# Patient Record
Sex: Female | Born: 1960 | Race: White | Hispanic: No | Marital: Married | Smoking: Never smoker
Health system: Southern US, Community
[De-identification: ages and names within clinical notes are randomized; demographics above are authoritative.]

## PROBLEM LIST (undated history)

## (undated) DIAGNOSIS — C801 Malignant (primary) neoplasm, unspecified: Secondary | ICD-10-CM

## (undated) DIAGNOSIS — T8859XA Other complications of anesthesia, initial encounter: Secondary | ICD-10-CM

## (undated) DIAGNOSIS — Z9889 Other specified postprocedural states: Secondary | ICD-10-CM

## (undated) DIAGNOSIS — K219 Gastro-esophageal reflux disease without esophagitis: Secondary | ICD-10-CM

## (undated) DIAGNOSIS — J45909 Unspecified asthma, uncomplicated: Secondary | ICD-10-CM

## (undated) DIAGNOSIS — R112 Nausea with vomiting, unspecified: Secondary | ICD-10-CM

## (undated) DIAGNOSIS — G709 Myoneural disorder, unspecified: Secondary | ICD-10-CM

## (undated) DIAGNOSIS — T4145XA Adverse effect of unspecified anesthetic, initial encounter: Secondary | ICD-10-CM

## (undated) HISTORY — DX: Unspecified asthma, uncomplicated: J45.909

## (undated) HISTORY — PX: FOOT SURGERY: SHX648

## (undated) HISTORY — PX: DIAGNOSTIC LAPAROSCOPY: SUR761

## (undated) HISTORY — DX: Gastro-esophageal reflux disease without esophagitis: K21.9

---

## 1998-10-01 ENCOUNTER — Other Ambulatory Visit: Admission: RE | Admit: 1998-10-01 | Discharge: 1998-10-01 | Payer: Self-pay | Admitting: Obstetrics and Gynecology

## 1999-10-04 ENCOUNTER — Other Ambulatory Visit: Admission: RE | Admit: 1999-10-04 | Discharge: 1999-10-04 | Payer: Self-pay | Admitting: Obstetrics and Gynecology

## 2000-10-18 ENCOUNTER — Other Ambulatory Visit: Admission: RE | Admit: 2000-10-18 | Discharge: 2000-10-18 | Payer: Self-pay | Admitting: Obstetrics and Gynecology

## 2001-10-08 ENCOUNTER — Other Ambulatory Visit: Admission: RE | Admit: 2001-10-08 | Discharge: 2001-10-08 | Payer: Self-pay | Admitting: Obstetrics and Gynecology

## 2002-10-29 ENCOUNTER — Other Ambulatory Visit: Admission: RE | Admit: 2002-10-29 | Discharge: 2002-10-29 | Payer: Self-pay | Admitting: Obstetrics & Gynecology

## 2004-01-04 HISTORY — PX: CERVICAL CONIZATION W/BX: SHX1330

## 2005-11-05 ENCOUNTER — Inpatient Hospital Stay (HOSPITAL_COMMUNITY): Admission: AD | Admit: 2005-11-05 | Discharge: 2005-11-05 | Payer: Self-pay | Admitting: Obstetrics & Gynecology

## 2006-02-17 ENCOUNTER — Inpatient Hospital Stay (HOSPITAL_COMMUNITY): Admission: AD | Admit: 2006-02-17 | Discharge: 2006-02-17 | Payer: Self-pay | Admitting: Obstetrics and Gynecology

## 2006-03-06 ENCOUNTER — Inpatient Hospital Stay (HOSPITAL_COMMUNITY): Admission: AD | Admit: 2006-03-06 | Discharge: 2006-03-06 | Payer: Self-pay | Admitting: Obstetrics and Gynecology

## 2006-03-17 ENCOUNTER — Inpatient Hospital Stay (HOSPITAL_COMMUNITY): Admission: AD | Admit: 2006-03-17 | Discharge: 2006-03-20 | Payer: Self-pay | Admitting: Obstetrics & Gynecology

## 2012-11-09 ENCOUNTER — Emergency Department (HOSPITAL_COMMUNITY): Payer: BC Managed Care – PPO

## 2012-11-09 ENCOUNTER — Encounter (HOSPITAL_COMMUNITY): Payer: Self-pay | Admitting: Emergency Medicine

## 2012-11-09 ENCOUNTER — Observation Stay (HOSPITAL_COMMUNITY)
Admission: EM | Admit: 2012-11-09 | Discharge: 2012-11-10 | Disposition: A | Discharge status: Home / Self Care | Payer: BC Managed Care – PPO | Attending: Family Medicine | Admitting: Family Medicine

## 2012-11-09 DIAGNOSIS — Z7982 Long term (current) use of aspirin: Secondary | ICD-10-CM | POA: Insufficient documentation

## 2012-11-09 DIAGNOSIS — R079 Chest pain, unspecified: Principal | ICD-10-CM | POA: Diagnosis present

## 2012-11-09 DIAGNOSIS — E876 Hypokalemia: Secondary | ICD-10-CM | POA: Diagnosis present

## 2012-11-09 HISTORY — DX: Myoneural disorder, unspecified: G70.9

## 2012-11-09 LAB — URINALYSIS, ROUTINE W REFLEX MICROSCOPIC
Bilirubin Urine: NEGATIVE
Glucose, UA: NEGATIVE mg/dL
Hgb urine dipstick: NEGATIVE
Ketones, ur: NEGATIVE mg/dL
Leukocytes, UA: NEGATIVE
Nitrite: NEGATIVE
Protein, ur: NEGATIVE mg/dL
Specific Gravity, Urine: 1.013 (ref 1.005–1.030)
Urobilinogen, UA: 0.2 mg/dL (ref 0.0–1.0)
pH: 6.5 (ref 5.0–8.0)

## 2012-11-09 LAB — BASIC METABOLIC PANEL
BUN: 12 mg/dL (ref 6–23)
CO2: 24 mEq/L (ref 19–32)
Calcium: 9.7 mg/dL (ref 8.4–10.5)
Chloride: 101 mEq/L (ref 96–112)
Creatinine, Ser: 0.8 mg/dL (ref 0.50–1.10)
GFR calc Af Amer: >90 mL/min (ref >90)
GFR calc non Af Amer: 85 mL/min — ABNORMAL LOW (ref >90)
Glucose, Bld: 100 mg/dL — ABNORMAL HIGH (ref 70–99)
Potassium: 3.4 mEq/L — ABNORMAL LOW (ref 3.5–5.1)
Sodium: 136 mEq/L (ref 135–145)

## 2012-11-09 LAB — POCT I-STAT TROPONIN I: Troponin i, poc: 0 ng/mL (ref 0.00–0.08)

## 2012-11-09 LAB — POCT I-STAT, CHEM 8
BUN: 14 mg/dL (ref 6–23)
Calcium, Ion: 1.26 mmol/L — ABNORMAL HIGH (ref 1.12–1.23)
Chloride: 103 mEq/L (ref 96–112)
Creatinine, Ser: 1.1 mg/dL (ref 0.50–1.10)
Glucose, Bld: 87 mg/dL (ref 70–99)
HCT: 46 % (ref 36.0–46.0)
Hemoglobin: 15.6 g/dL — ABNORMAL HIGH (ref 12.0–15.0)
Potassium: 2.9 mEq/L — ABNORMAL LOW (ref 3.5–5.1)
Sodium: 143 mEq/L (ref 135–145)
TCO2: 26 mmol/L (ref 0–100)

## 2012-11-09 LAB — CBC
HCT: 42.9 % (ref 36.0–46.0)
Hemoglobin: 14.9 g/dL (ref 12.0–15.0)
MCH: 32.7 pg (ref 26.0–34.0)
MCHC: 34.7 g/dL (ref 30.0–36.0)
MCV: 94.3 fL (ref 78.0–100.0)
Platelets: 317 10*3/uL (ref 150–400)
RBC: 4.55 MIL/uL (ref 3.87–5.11)
RDW: 12.1 % (ref 11.5–15.5)
WBC: 10.1 10*3/uL (ref 4.0–10.5)

## 2012-11-09 LAB — D-DIMER, QUANTITATIVE (NOT AT ARMC): D-Dimer, Quant: <0.27 ug/mL-FEU (ref 0.00–0.48)

## 2012-11-09 MED ORDER — ZOLPIDEM TARTRATE 5 MG PO TABS: 5.0000 mg | ORAL_TABLET | Freq: Every evening | ORAL | Status: DC | PRN

## 2012-11-09 MED ORDER — ASPIRIN 325 MG PO TABS
325.0000 mg | ORAL_TABLET | Freq: Every day | ORAL | Status: DC
  Administered 2012-11-10: 325 mg via ORAL
  Filled 2012-11-09: qty 1

## 2012-11-09 MED ORDER — ONDANSETRON HCL 4 MG/2ML IJ SOLN: 4.0000 mg | Freq: Four times a day (QID) | INTRAMUSCULAR | Status: DC | PRN

## 2012-11-09 MED ORDER — HYDROMORPHONE HCL PF 1 MG/ML IJ SOLN: 0.5000 mg | INTRAMUSCULAR | Status: DC | PRN

## 2012-11-09 MED ORDER — ENOXAPARIN SODIUM 40 MG/0.4ML ~~LOC~~ SOLN
40.0000 mg | Freq: Every day | SUBCUTANEOUS | Status: DC
  Administered 2012-11-10: 40 mg via SUBCUTANEOUS
  Filled 2012-11-09 (×2): qty 0.4

## 2012-11-09 MED ORDER — ACETAMINOPHEN 650 MG RE SUPP: 650.0000 mg | Freq: Four times a day (QID) | RECTAL | Status: DC | PRN

## 2012-11-09 MED ORDER — SODIUM CHLORIDE 0.9 % IV SOLN
INTRAVENOUS | Status: DC
  Administered 2012-11-10: 20 mL/h via INTRAVENOUS

## 2012-11-09 MED ORDER — ALUM & MAG HYDROXIDE-SIMETH 200-200-20 MG/5ML PO SUSP: 30.0000 mL | Freq: Four times a day (QID) | ORAL | Status: DC | PRN

## 2012-11-09 MED ORDER — OXYCODONE HCL 5 MG PO TABS: 5.0000 mg | ORAL_TABLET | ORAL | Status: DC | PRN

## 2012-11-09 MED ORDER — ONDANSETRON HCL 4 MG PO TABS: 4.0000 mg | ORAL_TABLET | Freq: Four times a day (QID) | ORAL | Status: DC | PRN

## 2012-11-09 MED ORDER — ACETAMINOPHEN 325 MG PO TABS
650.0000 mg | ORAL_TABLET | Freq: Four times a day (QID) | ORAL | Status: DC | PRN
  Administered 2012-11-10: 650 mg via ORAL
  Filled 2012-11-09: qty 2

## 2012-11-09 MED ORDER — SODIUM CHLORIDE 0.9 % IJ SOLN
3.0000 mL | Freq: Two times a day (BID) | INTRAMUSCULAR | Status: DC
  Administered 2012-11-10 (×2): 3 mL via INTRAVENOUS

## 2012-11-09 NOTE — ED Provider Notes (Signed)
CSN: [id number]     Arrival date & time 11/09/12  1945 History   First MD Initiated Contact with Patient 11/09/12 2018     Chief Complaint  Patient presents with  . Chest Pain   (Consider location/radiation/quality/duration/timing/severity/associated sxs/prior Treatment) HPI Comments: 52 year old female presents with waxing and waning chest pain. She states she woke up with jaw pain today has progressed to chest pain, neck pain, and back pain. She also has some arm symptoms. She describes the pain as "agonizing". She was asked to clarify this she is unable to further specify. Has intermittent shortness of breath. She is unable to see if this gets worse with exertion or food. Denies abdominal pain. She has had intermittent diaphoresis and nausea. She travels to [place] for work but otherwise has not had any significant travel or leg swelling. No cough or hemoptysis. She states that she is in good health and has no medical problems. Does not smoke does not have a family history of early coronary disease. Currently has no chest pain.   No past medical history on file. No past surgical history on file. No family history on file. History  Substance Use Topics  . Smoking status: Never Smoker   . Smokeless tobacco: Not on file  . Alcohol Use: Yes   OB History   Grav Para Term Preterm Abortions TAB SAB Ect Mult Living                 Review of Systems  Constitutional: Positive for diaphoresis.  HENT:       Jaw pain  Respiratory: Negative for shortness of breath.   Cardiovascular: Positive for chest pain.  Gastrointestinal: Positive for nausea. Negative for abdominal pain.  Musculoskeletal: Positive for back pain and neck pain.  Neurological: Negative for weakness.  All other systems reviewed and are negative.    Allergies  Other and Prednisone  Home Medications   Current Outpatient Rx  Name  Route  Sig  Dispense  Refill  . aspirin 325 MG tablet   Oral   Take 325 mg by mouth  daily.         Marland Kitchen aspirin 81 MG tablet   Oral   Take 81 mg by mouth daily.         Marland Kitchen glucosamine-chondroitin 500-400 MG tablet   Oral   Take 1 tablet by mouth 3 (three) times daily.         Marland Kitchen GREEN COFFEE BEAN PO   Oral   Take 1 tablet by mouth daily.          BP 161/97  Pulse 73  Temp(Src) 98.2 F (36.8 C) (Oral)  Resp 20  SpO2 100% Physical Exam  Vitals reviewed. Constitutional: She is oriented to person, place, and time. She appears well-developed and well-nourished. No distress.  HENT:  Head: Normocephalic and atraumatic.  Right Ear: External ear normal.  Left Ear: External ear normal.  Nose: Nose normal.  Eyes: Right eye exhibits no discharge. Left eye exhibits no discharge.  Cardiovascular: Normal rate, regular rhythm and normal heart sounds.   Pulmonary/Chest: Effort normal and breath sounds normal. She exhibits tenderness.  Abdominal: Soft. There is no tenderness.  Musculoskeletal: She exhibits no edema and no tenderness.  Neurological: She is alert and oriented to person, place, and time.  Skin: Skin is warm and dry.    ED Course  Procedures (including critical care time) Labs Review Labs Reviewed  URINALYSIS, ROUTINE W REFLEX MICROSCOPIC - Abnormal; Notable for  the following:    APPearance CLOUDY (*)    All other components within normal limits  POCT I-STAT, CHEM 8 - Abnormal; Notable for the following:    Potassium 2.9 (*)    Calcium, Ion 1.26 (*)    Hemoglobin 15.6 (*)    All other components within normal limits  CBC  D-DIMER, QUANTITATIVE  BASIC METABOLIC PANEL  POCT I-STAT TROPONIN I   Imaging Review Dg Chest 2 View  11/09/2012   CLINICAL DATA:  Chest pain  EXAM: CHEST - 2 VIEW  COMPARISON:  02/17/2006  FINDINGS: Mild thoracic dextroscoliosis without evident underlying vertebral anomaly as before. Lungs clear. Heart size normal. No effusion.  IMPRESSION: No acute cardiopulmonary disease.   Electronically Signed   By: Oley Balm M.D.    On: 11/09/2012 21:47    EKG Interpretation     Ventricular Rate:  71 PR Interval:  157 QRS Duration: 92 QT Interval:  385 QTC Calculation: 418 R Axis:   71 Text Interpretation:  Normal sinus rhythm No acute ischemia No old tracing to compare            MDM   1. Chest pain    Patient with atypical chest pain. No risk factors except age of 72. Took full dose ASA prior to arrival. Pain free here. Given that these symptoms woke her up, radiate to her neck and arm and no other likely diagnosis, will place into obs for ACS rule out.     Audree Camel, MD 11/09/12 [phone]

## 2012-11-09 NOTE — ED Notes (Signed)
From home pt. Came in with complaint of chest pain which started at 8am this morning @8 /10 , pt. Thought that it was "indigestion" . Pt. Claimed that pain radiated on her left  breast area and neck. pain worsen with activities.  Pt. Went to Urgent [facility] at 6pm and was advised to come to ED.Pt. Also claimed of taking 4 pills of baby Aspirin at 7pm this evening. Pt. Denies SOB. Pt.  Was ambulatory going to the room and denies N/V, nor dizziness. Pain at this time is 4/10 .

## 2012-11-09 NOTE — ED Notes (Signed)
Patient transported to X-ray 

## 2012-11-09 NOTE — ED Notes (Signed)
EKG given to EDP,Ghim,MD.

## 2012-11-10 ENCOUNTER — Encounter (HOSPITAL_COMMUNITY): Payer: Self-pay | Admitting: *Deleted

## 2012-11-10 DIAGNOSIS — R079 Chest pain, unspecified: Secondary | ICD-10-CM | POA: Diagnosis present

## 2012-11-10 DIAGNOSIS — E876 Hypokalemia: Secondary | ICD-10-CM | POA: Diagnosis present

## 2012-11-10 LAB — LIPID PANEL
Cholesterol: 185 mg/dL (ref 0–200)
HDL: 53 mg/dL (ref >39)
LDL Cholesterol: 109 mg/dL — ABNORMAL HIGH (ref 0–99)
Total CHOL/HDL Ratio: 3.5 RATIO
Triglycerides: 114 mg/dL (ref <150)
VLDL: 23 mg/dL (ref 0–40)

## 2012-11-10 LAB — CBC
HCT: 37.5 % (ref 36.0–46.0)
Hemoglobin: 13 g/dL (ref 12.0–15.0)
MCH: 32.7 pg (ref 26.0–34.0)
MCHC: 34.7 g/dL (ref 30.0–36.0)
MCV: 94.5 fL (ref 78.0–100.0)
Platelets: 237 10*3/uL (ref 150–400)
RBC: 3.97 MIL/uL (ref 3.87–5.11)
RDW: 12.2 % (ref 11.5–15.5)
WBC: 6.5 10*3/uL (ref 4.0–10.5)

## 2012-11-10 LAB — TROPONIN I
Troponin I: <0.3 ng/mL (ref <0.30)
Troponin I: <0.3 ng/mL (ref <0.30)
Troponin I: <0.3 ng/mL (ref <0.30)

## 2012-11-10 LAB — BASIC METABOLIC PANEL
BUN: 12 mg/dL (ref 6–23)
CO2: 25 mEq/L (ref 19–32)
Calcium: 9.4 mg/dL (ref 8.4–10.5)
Chloride: 105 mEq/L (ref 96–112)
Creatinine, Ser: 0.93 mg/dL (ref 0.50–1.10)
GFR calc Af Amer: 82 mL/min — ABNORMAL LOW (ref >90)
GFR calc non Af Amer: 70 mL/min — ABNORMAL LOW (ref >90)
Glucose, Bld: 103 mg/dL — ABNORMAL HIGH (ref 70–99)
Potassium: 3.6 mEq/L (ref 3.5–5.1)
Sodium: 138 mEq/L (ref 135–145)

## 2012-11-10 LAB — MAGNESIUM: Magnesium: 2.1 mg/dL (ref 1.5–2.5)

## 2012-11-10 MED ORDER — POTASSIUM CHLORIDE CRYS ER 20 MEQ PO TBCR
40.0000 meq | EXTENDED_RELEASE_TABLET | Freq: Once | ORAL | Status: AC
  Administered 2012-11-10: 40 meq via ORAL
  Filled 2012-11-10: qty 2

## 2012-11-10 NOTE — ED Notes (Signed)
MD at bedside. 

## 2012-11-10 NOTE — Discharge Summary (Signed)
Physician Discharge Summary  MERCADIES CO MVH:[id number] DOB: 1960/08/10 DOA: 11/09/2012  PCP: No primary provider on file.  Admit date: 11/09/2012 Discharge date: 11/10/2012  Time spent: 50* minutes  Recommendations for Outpatient Follow-up:  1. *Follow up cardiology in 1 week  Discharge Diagnoses:  Principal Problem:   Chest pain Active Problems:   Hypokalemia   Discharge Condition: Stable  Diet recommendation: Heart healthy diet  Filed Weights   11/10/12 0100  Weight: 78.7 kg (173 lb 8 oz)    History of present illness:  52 y.o. Eaton who presents to the ED with complaints of chest pain in the center of her chest that radiates across her chest and at times also radiates into her shoulders and jaw. She rated the pain at a 8/10 and was constant and lasted 12 hours until she had been given Aspirin in the ED. She reports taking 2 Baby Aspirin at home without relief. She reports also having nausea and diaphoresis associated with the pain but denies having any SOB. She was evaluated in the ED and found to have an initial negative troponin and a normal EKG and was referred for further evaluation   Hospital Course:   Chest pain- Patient's chest pain has resolved, cardiac enzymes x 3 are negative. Called and discussed with cardiology Dr Melburn Popper, patient can follow up cardiology as outpatient for cardiac stress test. Will continue baby aspirin at home.  Hypokalemia- Potassium replaced.  Procedures:  *None  Consultations:  None  Discharge Exam: Filed Vitals:   11/10/12 0500  BP: 109/66  Pulse: 65  Temp: 97.5 F (36.4 C)  Resp: 20    Physical Exam: Head: Normocephalic, atraumatic.  Eyes: No signs of jaundice, EOMI Nose: Mucous membranes dry.  Throat: Oropharynx nonerythematous, no exudate appreciated.  Neck: supple,No deformities, masses, or tenderness noted. Lungs: Normal respiratory effort. B/L Clear to auscultation, no crackles or wheezes.  Heart: Regular RR.  S1 and S2 normal  Abdomen: BS normoactive. Soft, Nondistended, non-tender.  Extremities: No pretibial edema, no erythema  Discharge Instructions  Discharge Orders   Future Orders Complete By Expires   Diet - low sodium heart healthy  As directed    Increase activity slowly  As directed        Medication List         aspirin 81 MG tablet  Take 81 mg by mouth daily.     glucosamine-chondroitin 500-400 MG tablet  Take 1 tablet by mouth 3 (three) times daily.     GREEN COFFEE BEAN PO  Take 1 tablet by mouth daily.       Allergies  Allergen Reactions  . Other Other (See Comments)    Synthetic pain medication makes her heart race  . Prednisone Other (See Comments)    Pill form, upset stomach, states it makes her crazy      The results of significant diagnostics from this hospitalization (including imaging, microbiology, ancillary and laboratory) are listed below for reference.    Significant Diagnostic Studies: Dg Chest 2 View  11/09/2012   CLINICAL DATA:  Chest pain  EXAM: CHEST - 2 VIEW  COMPARISON:  02/17/2006  FINDINGS: Mild thoracic dextroscoliosis without evident underlying vertebral anomaly as before. Lungs clear. Heart size normal. No effusion.  IMPRESSION: No acute cardiopulmonary disease.   Electronically Signed   By: Oley Balm M.D.   On: 11/09/2012 21:47    Microbiology: No results found for this or any previous visit (from the past 240 hour(s)).  Labs: Basic Metabolic Panel:  Recent Labs Lab 11/09/12 1955 11/09/12 2034 11/10/12 0550  NA 136 143 138  K 3.4* 2.9* 3.6  CL 101 103 105  CO2 24  --  25  GLUCOSE 100* 87 103*  BUN 12 14 12   CREATININE 0.80 1.10 0.93  CALCIUM 9.7  --  9.4  MG  --   --  2.1   Liver Function Tests: No results found for this basename: AST, ALT, ALKPHOS, BILITOT, PROT, ALBUMIN,  in the last 168 hours No results found for this basename: LIPASE, AMYLASE,  in the last 168 hours No results found for this basename:  AMMONIA,  in the last 168 hours CBC:  Recent Labs Lab 11/09/12 2009 11/09/12 2034 11/10/12 0550  WBC 10.1  --  6.5  HGB 14.9 15.6* 13.0  HCT 42.9 46.0 37.5  MCV 94.3  --  94.5  PLT 317  --  237   Cardiac Enzymes:  Recent Labs Lab 11/10/12 0003 11/10/12 0550 11/10/12 1144  TROPONINI <0.30 <0.30 <0.30   BNP: BNP (last 3 results) No results found for this basename: PROBNP,  in the last 8760 hours CBG: No results found for this basename: GLUCAP,  in the last 168 hours     Signed:  LAMA,GAGAN S  [facility] 11/10/2012, 2:23 PM

## 2012-11-10 NOTE — H&P (Signed)
[facility] History and Physical  ZANITA MILLMAN ZOX:[id number] DOB: 02-Feb-1960 DOA: 11/09/2012  Referring physician:  EDP PCP: No primary provider on file.  Specialists:   Chief Complaint:  Chest Pain  HPI: Ashley Eaton is a 52 y.o. female who presents to the ED with complaints of chest pain in the center of her chest that radiates across her chest and at times also radiates into her shoulders and jaw.  She rated the pain at a 8/10 and was constant and lasted 12 hours until she had been given Aspirin in the ED.  She reports taking 2 Baby Aspirin at home without relief.   She reports also having nausea and diaphoresis associated with the pain but denies having any SOB.   She was evaluated in the ED and found to have an initial negative troponin and a normal EKG and was referred for further evaluation.      Review of Systems: The patient denies anorexia, fever, chills, headaches, weight loss, vision loss, diplopia, dizziness, decreased hearing, rhinitis, hoarseness, syncope, dyspnea on exertion, peripheral edema, balance deficits, cough, hemoptysis, abdominal pain, vomiting, diarrhea, constipation, hematemesis, melena, hematochezia, severe indigestion/heartburn, dysuria, hematuria, incontinence, muscle weakness, suspicious skin lesions, transient blindness, difficulty walking, depression, unusual weight change, abnormal bleeding, enlarged lymph nodes, angioedema, and breast masses.    Past Medical History  Diagnosis Date  . Neuromuscular disorder     Morton's neuroma, plantar fasciitis    Past Surgical History  Procedure Laterality Date  . Foot surgery  2010    For Plantar Faciitis  . Foot surgery  1992    For Morton's Neuroma    Prior to Admission medications   Medication Sig Start Date End Date Taking? Authorizing Provider  aspirin 325 MG tablet Take 325 mg by mouth daily.   Yes Historical Provider, MD  aspirin 81 MG tablet Take 81 mg by mouth daily.   Yes Historical  Provider, MD  glucosamine-chondroitin 500-400 MG tablet Take 1 tablet by mouth 3 (three) times daily.   Yes Historical Provider, MD  GREEN COFFEE BEAN PO Take 1 tablet by mouth daily.   Yes Historical Provider, MD    Allergies  Allergen Reactions  . Other Other (See Comments)    Synthetic pain medication makes her heart race  . Prednisone Other (See Comments)    Pill form, upset stomach, states it makes her crazy    Social History:  reports that she has never smoked. She does not have any smokeless tobacco history on file. She reports that she drinks alcohol. She reports that she does not use illicit drugs.     Family History  Problem Relation Age of Onset  . Heart failure Mother   . Diabetes Mother   . Cancer - Other Father     Renal Cell    (be sure to complete)   Physical Exam:  GEN:  Pleasant  Obese  52 y.o. Caucasian female  examined  and in no acute distress; cooperative with exam Filed Vitals:   11/09/12 1958 11/10/12 0004 11/10/12 0100  BP: 161/97 101/66 127/78  Pulse: 73 66 59  Temp: 98.2 F (36.8 C)  97.5 F (36.4 C)  TempSrc: Oral  Oral  Resp: 20 17 24   Height:   5\' 2"  (1.575 m)  Weight:   78.7 kg (173 lb 8 oz)  SpO2: 100% 99% 100%   Blood pressure 127/78, pulse 59, temperature 97.5 F (36.4 C), temperature source Oral, resp. rate 24, height 5\' 2"  (  1.575 m), weight 78.7 kg (173 lb 8 oz), SpO2 100.00%. PSYCH: She is alert and oriented x4; does not appear anxious does not appear depressed; affect is normal HEENT: Normocephalic and Atraumatic, Mucous membranes pink; PERRLA; EOM intact; Fundi:  Benign;  No scleral icterus, Nares: Patent, Oropharynx: Clear, Fair Dentition, Neck:  FROM, no cervical lymphadenopathy nor thyromegaly or carotid bruit; no JVD; Breasts:: Not examined CHEST WALL: No tenderness CHEST: Normal respiration, clear to auscultation bilaterally HEART: Regular rate and rhythm; no murmurs rubs or gallops BACK: No kyphosis or scoliosis; no CVA  tenderness ABDOMEN: Positive Bowel Sounds, Obese, soft non-tender; no masses, no organomegaly. Rectal Exam: Not done EXTREMITIES: No cyanosis, clubbing or edema; no ulcerations. Genitalia: not examined PULSES: 2+ and symmetric SKIN: Normal hydration no rash or ulceration CNS: Cranial nerves 2-12 grossly intact no focal neurologic deficit    Labs on Admission:  Basic Metabolic Panel:  Recent Labs Lab 11/09/12 1955 11/09/12 2034  NA 136 143  K 3.4* 2.9*  CL 101 103  CO2 24  --   GLUCOSE 100* 87  BUN 12 14  CREATININE 0.80 1.10  CALCIUM 9.7  --    Liver Function Tests: No results found for this basename: AST, ALT, ALKPHOS, BILITOT, PROT, ALBUMIN,  in the last 168 hours No results found for this basename: LIPASE, AMYLASE,  in the last 168 hours No results found for this basename: AMMONIA,  in the last 168 hours CBC:  Recent Labs Lab 11/09/12 2009 11/09/12 2034  WBC 10.1  --   HGB 14.9 15.6*  HCT 42.9 46.0  MCV 94.3  --   PLT 317  --    Cardiac Enzymes:  Recent Labs Lab 11/10/12 0003  TROPONINI <0.30    BNP (last 3 results) No results found for this basename: PROBNP,  in the last 8760 hours CBG: No results found for this basename: GLUCAP,  in the last 168 hours  Radiological Exams on Admission: Dg Chest 2 View  11/09/2012   CLINICAL DATA:  Chest pain  EXAM: CHEST - 2 VIEW  COMPARISON:  02/17/2006  FINDINGS: Mild thoracic dextroscoliosis without evident underlying vertebral anomaly as before. Lungs clear. Heart size normal. No effusion.  IMPRESSION: No acute cardiopulmonary disease.   Electronically Signed   By: Oley Balm M.D.   On: 11/09/2012 21:47     EKG: Independently reviewed.  Normal sinus Rhythm No Acute S-T changes   Assessment/Plan Principal Problem:   Chest pain Active Problems:   Hypokalemia    1.  Chest Pain-  Telemetry Monitoring, Cycle Troponins, Nitropaste, O2 ASA Rx, check Fasting Lipids.    2. Hypokalemia- Replete K= and check  Magnesium level.    3.  DVT prophylaxis with Lovenox.      Code Status:   FULL CODE Family Communication:    Husband at Bedside Disposition Plan:   Observation  Time spent:   52 Minutes  Ron Parker [facility] Pager [phone]  If 7PM-7AM, please contact night-coverage [email or web address] Password [email or web address] 11/10/2012, 5:15 AM

## 2012-11-10 NOTE — Progress Notes (Signed)
Discharge and follow up instructions reviewed with patient and husband no questions at this time . Patient discharged home.

## 2012-11-22 ENCOUNTER — Encounter: Payer: Self-pay | Admitting: Cardiology

## 2012-11-22 ENCOUNTER — Ambulatory Visit (INDEPENDENT_AMBULATORY_CARE_PROVIDER_SITE_OTHER): Payer: BC Managed Care – PPO | Admitting: Cardiology

## 2012-11-22 VITALS — BP 115/78 | HR 66 | Ht 61.0 in | Wt 177.0 lb

## 2012-11-22 DIAGNOSIS — R079 Chest pain, unspecified: Secondary | ICD-10-CM

## 2012-11-22 MED ORDER — NITROGLYCERIN 0.4 MG SL SUBL: 0.4000 mg | SUBLINGUAL_TABLET | SUBLINGUAL | Status: DC | PRN

## 2012-11-22 NOTE — Progress Notes (Signed)
Patient ID: Ashley Eaton, female   DOB: [birth year], 52 y.o.   MRN: [id number]     Patient Name: Ashley Eaton Date of Encounter: 11/22/2012  Primary Care Provider:  No primary provider on file. Primary Cardiologist:  Ashley Eaton, H   Problem List   Past Medical History  Diagnosis Date  . Neuromuscular disorder     Morton's neuroma, plantar fasciitis   Past Surgical History  Procedure Laterality Date  . Foot surgery  2010    For Plantar Faciitis  . Foot surgery  1992    For Morton's Neuroma    Allergies  Allergies  Allergen Reactions  . Other Other (See Comments)    Synthetic pain medication makes her heart race  . Prednisone Other (See Comments)    Pill form, upset stomach, states it makes her crazy    HPI  52 y.o. female who presented to the ED with complaints of chest pain in the center of her chest that radiates across her chest and at times also radiates into her shoulders and jaw. She rated the pain at a 8/10 and was constant and lasted 12 hours until she had been given Aspirin in the ED. She reports taking 2 Baby Aspirin at home without relief. She reports also having nausea and diaphoresis associated with the pain but denies having any SOB. She was evaluated in the ED and found to have an initial negative troponin and a normal EKG and was referred for further evaluation. Since the discharge in the ED she has had two more of those episodes, but much milder and of shorter duration. She denies palpitations or syncope. Her mother had multiple MIs and CHF in her 17'. She has never smoked.  Home Medications  Prior to Admission medications   Medication Sig Start Date End Date Taking? Authorizing Provider  aspirin 81 MG tablet Take 81 mg by mouth daily.   Yes Historical Provider, MD  Cyanocobalamin (B-12) 500 MCG SUBL Place under the tongue as directed.   Yes Historical Provider, MD  GLUCOSAMINE-CHONDROITIN PO Take 1 tablet by mouth daily.   Yes Historical  Provider, MD  GREEN COFFEE BEAN PO Take 1 tablet by mouth daily.   Yes Historical Provider, MD  Multiple Vitamin (MULTIVITAMIN) capsule Take 4 capsules by mouth daily.   Yes Historical Provider, MD    Family History  Family History  Problem Relation Age of Onset  . Heart failure Mother   . Diabetes Mother   . Cancer - Other Father     Renal Cell    Social History  History   Social History  . Marital Status: Married    Spouse Name: N/A    Number of Children: N/A  . Years of Education: N/A   Occupational History  . Not on file.   Social History Main Topics  . Smoking status: Never Smoker   . Smokeless tobacco: Not on file  . Alcohol Use: Yes  . Drug Use: No  . Sexual Activity: Yes    Birth Control/ Protection: IUD   Other Topics Concern  . Not on file   Social History Narrative  . No narrative on file     Review of Systems, as per HPI, otherwise negative General:  No chills, fever, night sweats or weight changes.  Cardiovascular:  No chest pain, dyspnea on exertion, edema, orthopnea, palpitations, paroxysmal nocturnal dyspnea. Dermatological: No rash, lesions/masses Respiratory: No cough, dyspnea Urologic: No hematuria, dysuria Abdominal:   No nausea, vomiting,  diarrhea, bright red blood per rectum, melena, or hematemesis Neurologic:  No visual changes, wkns, changes in mental status. All other systems reviewed and are otherwise negative except as noted above.  Physical Exam  Blood pressure 115/78, pulse 66, height 5\' 1"  (1.549 m), weight 177 lb (80.287 kg).  General: Pleasant, NAD Psych: Normal affect. Neuro: Alert and oriented X 3. Moves all extremities spontaneously. HEENT: Normal  Neck: Supple without bruits or JVD. Lungs:  Resp regular and unlabored, CTA. Heart: RRR no s3, s4, or murmurs. Abdomen: Soft, non-tender, non-distended, BS + x 4.  Extremities: No clubbing, cyanosis or edema. DP/PT/Radials 2+ and equal bilaterally.  Labs:  No results  found for this basename: CKTOTAL, CKMB, TROPONINI,  in the last 72 hours Lab Results  Component Value Date   WBC 6.5 11/10/2012   HGB 13.0 11/10/2012   HCT 37.5 11/10/2012   MCV 94.5 11/10/2012   PLT 237 11/10/2012   No results found for this basename: NA, K, CL, CO2, BUN, CREATININE, CALCIUM, LABALBU, PROT, BILITOT, ALKPHOS, ALT, AST, GLUCOSE,  in the last 168 hours Lab Results  Component Value Date   CHOL 185 11/10/2012   HDL 53 11/10/2012   LDLCALC 109* 11/10/2012   TRIG 114 11/10/2012   Lab Results  Component Value Date   DDIMER <0.27 11/09/2012   No components found with this basename: POCBNP,   Accessory Clinical Findings  echocardiogram  ECG - normal sinus rhythm, 66 beats per minute normal EKG.  Lipid Panel     Component Value Date/Time   CHOL 185 11/10/2012 0550   TRIG 114 11/10/2012 0550   HDL 53 11/10/2012 0550   CHOLHDL 3.5 11/10/2012 0550   VLDL 23 11/10/2012 0550   LDLCALC 109* 11/10/2012 0550     Assessment & Plan  52 year female   1. Chest pain - typical in character but not exertional, we will order coronary CT to evaluate for atherosclerotic burden. We will prescribe sl NTG to be used in the interim.   2. Blood pressure controlled  3. Lipid profile - mildly elevated LDL, lifestyle changes for now and further management based on coronary CT results   Follow up in 3 weeks.   Lars Masson, MD, [facility] 11/22/2012, 2:14 PM

## 2012-11-22 NOTE — Patient Instructions (Addendum)
Your physician has requested that you have cardiac CT (December 5 PER DR. Delton See). Cardiac computed tomography (CT) is a painless test that uses an x-ray machine to take clear, detailed pictures of your heart. Please follow instruction sheet as given.  Your physician recommends that you schedule a follow-up appointment [id number] DR. Delton See IN 3 WEEKS  Your physician has recommended you make the following change in your medication:   START NITRO TABLETS AS NEEDED  Your physician recommends that you continue on your current medications as directed. Please refer to the Current Medication list given to you today.

## 2012-11-26 ENCOUNTER — Telehealth: Payer: Self-pay

## 2012-11-26 ENCOUNTER — Telehealth: Payer: Self-pay | Admitting: *Deleted

## 2012-11-26 ENCOUNTER — Encounter: Payer: Self-pay | Admitting: *Deleted

## 2012-11-26 DIAGNOSIS — R079 Chest pain, unspecified: Secondary | ICD-10-CM

## 2012-11-26 NOTE — Telephone Encounter (Signed)
Ashley Eaton Cardiac CT has been rescheduled to 12/07/12 @ 3 pm. The original appointment time was 2 pm.

## 2012-11-26 NOTE — Telephone Encounter (Signed)
The pt is advised that her insurance company did not give approval for her to have Cardiac CT so Dr Delton See would like for her to have GXT instead. The pt agreed to have GXT and is aware that someone from this office will call her to schedule date and time of GXT. Message sent to [facility] to call pt to schedule test.

## 2012-12-07 ENCOUNTER — Encounter (HOSPITAL_COMMUNITY): Payer: BC Managed Care – PPO

## 2012-12-07 ENCOUNTER — Ambulatory Visit (HOSPITAL_COMMUNITY): Payer: BC Managed Care – PPO

## 2012-12-13 ENCOUNTER — Ambulatory Visit (HOSPITAL_COMMUNITY)
Admission: RE | Admit: 2012-12-13 | Discharge: 2012-12-13 | Disposition: A | Discharge status: Home / Self Care | Payer: BC Managed Care – PPO | Source: Ambulatory Visit | Attending: Cardiology | Admitting: Cardiology

## 2012-12-13 DIAGNOSIS — R079 Chest pain, unspecified: Secondary | ICD-10-CM

## 2012-12-13 DIAGNOSIS — I4949 Other premature depolarization: Secondary | ICD-10-CM | POA: Insufficient documentation

## 2012-12-21 ENCOUNTER — Ambulatory Visit (INDEPENDENT_AMBULATORY_CARE_PROVIDER_SITE_OTHER): Payer: BC Managed Care – PPO | Admitting: Cardiology

## 2012-12-21 ENCOUNTER — Encounter: Payer: Self-pay | Admitting: Cardiology

## 2012-12-21 ENCOUNTER — Encounter (INDEPENDENT_AMBULATORY_CARE_PROVIDER_SITE_OTHER): Payer: Self-pay

## 2012-12-21 VITALS — BP 110/84 | HR 72 | Ht 61.0 in | Wt 175.0 lb

## 2012-12-21 DIAGNOSIS — E785 Hyperlipidemia, unspecified: Secondary | ICD-10-CM

## 2012-12-21 DIAGNOSIS — R079 Chest pain, unspecified: Secondary | ICD-10-CM

## 2012-12-21 DIAGNOSIS — E876 Hypokalemia: Secondary | ICD-10-CM

## 2012-12-21 NOTE — Progress Notes (Signed)
Patient ID: Ashley Eaton, female   DOB: [birth year], 52 y.o.   MRN: [id number]     Patient Name: Ashley Eaton Date of Encounter: 12/21/2012  Primary Care Provider:  Delorse Lek, MD Primary Cardiologist:  Tobias Alexander, H   Problem List   Past Medical History  Diagnosis Date  . Neuromuscular disorder     Morton's neuroma, plantar fasciitis   Past Surgical History  Procedure Laterality Date  . Foot surgery  2010    For Plantar Faciitis  . Foot surgery  1992    For Morton's Neuroma    Allergies  Allergies  Allergen Reactions  . Other Other (See Comments)    Synthetic pain medication makes her heart race  . Prednisone Other (See Comments)    Pill form, upset stomach, states it makes her crazy   HPI  52 y.o. female who presented to the ED with complaints of chest pain in the center of her chest that radiates across her chest and at times also radiates into her shoulders and jaw. She rated the pain at a 8/10 and was constant and lasted 12 hours until she had been given Aspirin in the ED. She reports taking 2 Baby Aspirin at home without relief. She reports also having nausea and diaphoresis associated with the pain but denies having any SOB. She was evaluated in the ED and found to have an initial negative troponin and a normal EKG and was referred for further evaluation. Since the discharge in the ED she has had two more of those episodes, but much milder and of shorter duration. She denies palpitations or syncope. Her mother had multiple MIs and CHF in her 73'. She has never smoked.  Home Medications  Prior to Admission medications   Medication Sig Start Date End Date Taking? Authorizing Provider  aspirin 81 MG tablet Take 81 mg by mouth daily.   Yes Historical Provider, MD  Cyanocobalamin (B-12) 500 MCG SUBL Place under the tongue as directed.   Yes Historical Provider, MD  GLUCOSAMINE-CHONDROITIN PO Take 1 tablet by mouth daily.   Yes Historical Provider, MD    GREEN COFFEE BEAN PO Take 1 tablet by mouth daily.   Yes Historical Provider, MD  Multiple Vitamin (MULTIVITAMIN) capsule Take 4 capsules by mouth daily.   Yes Historical Provider, MD    Family History  Family History  Problem Relation Age of Onset  . Heart failure Mother   . Diabetes Mother   . Cancer - Other Father     Renal Cell    Social History  History   Social History  . Marital Status: Married    Spouse Name: N/A    Number of Children: N/A  . Years of Education: N/A   Occupational History  . Not on file.   Social History Main Topics  . Smoking status: Never Smoker   . Smokeless tobacco: Not on file  . Alcohol Use: Yes  . Drug Use: No  . Sexual Activity: Yes    Birth Control/ Protection: IUD   Other Topics Concern  . Not on file   Social History Narrative  . No narrative on file     Review of Systems, as per HPI, otherwise negative General:  No chills, fever, night sweats or weight changes.  Cardiovascular:  No chest pain, dyspnea on exertion, edema, orthopnea, palpitations, paroxysmal nocturnal dyspnea. Dermatological: No rash, lesions/masses Respiratory: No cough, dyspnea Urologic: No hematuria, dysuria Abdominal:   No nausea, vomiting, diarrhea, bright  red blood per rectum, melena, or hematemesis Neurologic:  No visual changes, wkns, changes in mental status. All other systems reviewed and are otherwise negative except as noted above.  Physical Exam  Blood pressure 110/84, pulse 72, height 5\' 1"  (1.549 m), weight 175 lb (79.379 kg), SpO2 97.00%.  General: Pleasant, NAD Psych: Normal affect. Neuro: Alert and oriented X 3. Moves all extremities spontaneously. HEENT: Normal  Neck: Supple without bruits or JVD. Lungs:  Resp regular and unlabored, CTA. Heart: RRR no s3, s4, or murmurs. Abdomen: Soft, non-tender, non-distended, BS + x 4.  Extremities: No clubbing, cyanosis or edema. DP/PT/Radials 2+ and equal bilaterally.  Labs:  No results  found for this basename: CKTOTAL, CKMB, TROPONINI,  in the last 72 hours Lab Results  Component Value Date   WBC 6.5 11/10/2012   HGB 13.0 11/10/2012   HCT 37.5 11/10/2012   MCV 94.5 11/10/2012   PLT 237 11/10/2012   No results found for this basename: NA, K, CL, CO2, BUN, CREATININE, CALCIUM, LABALBU, PROT, BILITOT, ALKPHOS, ALT, AST, GLUCOSE,  in the last 168 hours Lab Results  Component Value Date   CHOL 185 11/10/2012   HDL 53 11/10/2012   LDLCALC 109* 11/10/2012   TRIG 114 11/10/2012   Lab Results  Component Value Date   DDIMER <0.27 11/09/2012   No components found with this basename: POCBNP,   Accessory Clinical Findings  echocardiogram  ECG - normal sinus rhythm, 66 beats per minute normal EKG.  Lipid Panel     Component Value Date/Time   CHOL 185 11/10/2012 0550   TRIG 114 11/10/2012 0550   HDL 53 11/10/2012 0550   CHOLHDL 3.5 11/10/2012 0550   VLDL 23 11/10/2012 0550   LDLCALC 109* 11/10/2012 0550    Exercise treadmill stress test 12/13/2012 Impression: Negative for ischemia GXT with the patient exercising to an 11.7 met workload and a HR of 164, representing 96% APMHR. Normal BP response to exercise. No chest pain or ischemic ECG changes. Confirmed by Tresa Endo MD, THOMAS [phone]) on 12/13/2012 5:37:26 PM    Assessment & Plan  52 year female   1. Chest pain - typical in character but not exertional, coronary CT was not approved by insurance. The patient underwent exercise treadmill stress test and exercises for 10 minutes, achieved 11.7 METS with no symptoms. No ischemia on ECG.  She didn't need to use NTG in the interim.  2. Blood pressure - controlled  3. Lipid profile - mildly elevated LDL, lifestyle changes. The patient was advised about proper type and frequency/time of exercise (minimum 30 minutes 5 x per week). She understands and is motivated to do so.    Follow up in 1 year with lipid profile and CMP.   Lars Masson, MD, [facility] 12/21/2012, 2:33  PM

## 2012-12-21 NOTE — Patient Instructions (Signed)
Your physician wants you to follow-up in: 1 year with fasting lab work ( cmp,lipids ) You will receive a reminder letter in the mail two months in advance. If you don't receive a letter, please call our office to schedule the follow-up appointment.

## 2013-02-01 ENCOUNTER — Ambulatory Visit
Admission: RE | Admit: 2013-02-01 | Discharge: 2013-02-01 | Disposition: A | Discharge status: Home / Self Care | Payer: Managed Care, Other (non HMO) | Source: Ambulatory Visit | Attending: Family Medicine | Admitting: Family Medicine

## 2013-02-01 ENCOUNTER — Other Ambulatory Visit: Payer: Self-pay | Admitting: Family Medicine

## 2013-02-01 DIAGNOSIS — M25571 Pain in right ankle and joints of right foot: Secondary | ICD-10-CM

## 2013-09-11 ENCOUNTER — Other Ambulatory Visit (INDEPENDENT_AMBULATORY_CARE_PROVIDER_SITE_OTHER): Payer: Self-pay | Admitting: Surgery

## 2013-09-11 ENCOUNTER — Ambulatory Visit (INDEPENDENT_AMBULATORY_CARE_PROVIDER_SITE_OTHER): Payer: Managed Care, Other (non HMO) | Admitting: Surgery

## 2013-10-08 ENCOUNTER — Encounter (HOSPITAL_COMMUNITY): Payer: Self-pay | Admitting: Pharmacy Technician

## 2013-10-11 NOTE — Pre-Procedure Instructions (Addendum)
Ashley Eaton  10/11/2013   Your procedure is scheduled on:  Tuesday October 15, 2013   Report to [facility] Admitting at 9:00 AM.  Call this number if you have problems the morning of surgery: [phone]   Remember:   Do not eat food or drink liquids after midnight.   Take these medicines the morning of surgery with A SIP OF WATER: NONE   Discontinue any aspirin ,nsaids,and herbal medications (Multivitamin)    Do not wear jewelry, make-up or nail polish.  Do not wear lotions, powders, or perfumes.   Do not shave 48 hours prior to surgery.   Do not bring valuables to the hospital.  [facility] is not responsible for any belongings or valuables.               Contacts, dentures or bridgework may not be worn into surgery.  Leave suitcase in the car. After surgery it may be brought to your room.  For patients admitted to the hospital, discharge time is determined by your treatment team.               Patients discharged the day of surgery will not be allowed to drive home.  Name and phone number of your driver: Family/Friend  Special Instructions: Special Instructions: [place] - Preparing for Surgery  Before surgery, you can play an important role.  Because skin is not sterile, your skin needs to be as free of germs as possible.  You can reduce the number of germs on you skin by washing with CHG (chlorahexidine gluconate) soap before surgery.  CHG is an antiseptic cleaner which kills germs and bonds with the skin to continue killing germs even after washing.  Please DO NOT use if you have an allergy to CHG or antibacterial soaps.  If your skin becomes reddened/irritated stop using the CHG and inform your nurse when you arrive at Short Stay.  Do not shave (including legs and underarms) for at least 48 hours prior to the first CHG shower.  You may shave your face.  Please follow these instructions carefully:   1.  Shower with CHG Soap the night before surgery and the  morning of Surgery.  2.  If you choose to wash your hair, wash your hair first as usual with your normal shampoo.  3.  After you shampoo, rinse your hair and body thoroughly to remove the Shampoo.  4.  Use CHG as you would any other liquid soap.  You can apply chg directly  to the skin and wash gently with scrungie or a clean washcloth.  5.  Apply the CHG Soap to your body ONLY FROM THE NECK DOWN.  Do not use on open wounds or open sores.  Avoid contact with your eyes ears, mouth and genitals (private parts).  Wash genitals (private parts)       with your normal soap.  6.  Wash thoroughly, paying special attention to the area where your surgery will be performed.  7.  Thoroughly rinse your body with warm water from the neck down.  8.  DO NOT shower/wash with your normal soap after using and rinsing off the CHG Soap.  9.  Pat yourself dry with a clean towel.            10.  Wear clean pajamas.            11.  Place clean sheets on your bed the night of your first shower  and do not sleep with pets.  Day of Surgery  Do not apply any lotions/deodorants the morning of surgery.  Please wear clean clothes to the hospital/surgery center.  Please read over the following fact sheets that you were given: Pain Booklet, Coughing and Deep Breathing and Surgical Site Infection Prevention

## 2013-10-14 ENCOUNTER — Encounter (HOSPITAL_COMMUNITY)
Admission: RE | Admit: 2013-10-14 | Discharge: 2013-10-14 | Disposition: A | Discharge status: Home / Self Care | Payer: Managed Care, Other (non HMO) | Source: Ambulatory Visit | Attending: Surgery | Admitting: Surgery

## 2013-10-14 ENCOUNTER — Encounter (HOSPITAL_COMMUNITY): Payer: Self-pay

## 2013-10-14 DIAGNOSIS — K828 Other specified diseases of gallbladder: Secondary | ICD-10-CM | POA: Diagnosis not present

## 2013-10-14 DIAGNOSIS — E785 Hyperlipidemia, unspecified: Secondary | ICD-10-CM | POA: Diagnosis not present

## 2013-10-14 DIAGNOSIS — K219 Gastro-esophageal reflux disease without esophagitis: Secondary | ICD-10-CM | POA: Diagnosis not present

## 2013-10-14 DIAGNOSIS — K811 Chronic cholecystitis: Secondary | ICD-10-CM | POA: Diagnosis not present

## 2013-10-14 DIAGNOSIS — G4730 Sleep apnea, unspecified: Secondary | ICD-10-CM | POA: Diagnosis not present

## 2013-10-14 HISTORY — DX: Other specified postprocedural states: R11.2

## 2013-10-14 HISTORY — DX: Malignant (primary) neoplasm, unspecified: C80.1

## 2013-10-14 HISTORY — DX: Other specified postprocedural states: Z98.890

## 2013-10-14 HISTORY — DX: Other complications of anesthesia, initial encounter: T88.59XA

## 2013-10-14 HISTORY — DX: Adverse effect of unspecified anesthetic, initial encounter: T41.45XA

## 2013-10-14 LAB — CBC
HCT: 39.3 % (ref 36.0–46.0)
Hemoglobin: 13.6 g/dL (ref 12.0–15.0)
MCH: 32 pg (ref 26.0–34.0)
MCHC: 34.6 g/dL (ref 30.0–36.0)
MCV: 92.5 fL (ref 78.0–100.0)
Platelets: 265 10*3/uL (ref 150–400)
RBC: 4.25 MIL/uL (ref 3.87–5.11)
RDW: 12 % (ref 11.5–15.5)
WBC: 5.6 10*3/uL (ref 4.0–10.5)

## 2013-10-14 LAB — HCG, SERUM, QUALITATIVE: Preg, Serum: NEGATIVE

## 2013-10-14 MED ORDER — CEFAZOLIN SODIUM-DEXTROSE 2-3 GM-% IV SOLR
2.0000 g | INTRAVENOUS | Status: AC
  Administered 2013-10-15: 2 g via INTRAVENOUS
  Filled 2013-10-14: qty 50

## 2013-10-15 ENCOUNTER — Ambulatory Visit (HOSPITAL_COMMUNITY): Payer: Managed Care, Other (non HMO) | Admitting: Certified Registered Nurse Anesthetist

## 2013-10-15 ENCOUNTER — Encounter (HOSPITAL_COMMUNITY): Payer: Self-pay | Admitting: Certified Registered Nurse Anesthetist

## 2013-10-15 ENCOUNTER — Encounter (HOSPITAL_COMMUNITY)
Admission: RE | Disposition: A | Discharge status: Home / Self Care | Payer: Self-pay | Source: Ambulatory Visit | Attending: Surgery

## 2013-10-15 ENCOUNTER — Encounter (HOSPITAL_COMMUNITY): Payer: Managed Care, Other (non HMO) | Admitting: Certified Registered Nurse Anesthetist

## 2013-10-15 ENCOUNTER — Ambulatory Visit (HOSPITAL_COMMUNITY): Payer: Managed Care, Other (non HMO)

## 2013-10-15 ENCOUNTER — Ambulatory Visit (HOSPITAL_COMMUNITY)
Admission: RE | Admit: 2013-10-15 | Discharge: 2013-10-15 | Disposition: A | Discharge status: Home / Self Care | Payer: Managed Care, Other (non HMO) | Source: Ambulatory Visit | Attending: Surgery | Admitting: Surgery

## 2013-10-15 DIAGNOSIS — K811 Chronic cholecystitis: Secondary | ICD-10-CM | POA: Diagnosis not present

## 2013-10-15 DIAGNOSIS — G4730 Sleep apnea, unspecified: Secondary | ICD-10-CM | POA: Insufficient documentation

## 2013-10-15 DIAGNOSIS — E785 Hyperlipidemia, unspecified: Secondary | ICD-10-CM | POA: Insufficient documentation

## 2013-10-15 DIAGNOSIS — K828 Other specified diseases of gallbladder: Secondary | ICD-10-CM | POA: Insufficient documentation

## 2013-10-15 DIAGNOSIS — K219 Gastro-esophageal reflux disease without esophagitis: Secondary | ICD-10-CM | POA: Insufficient documentation

## 2013-10-15 HISTORY — PX: CHOLECYSTECTOMY: SHX55

## 2013-10-15 SURGERY — LAPAROSCOPIC CHOLECYSTECTOMY WITH INTRAOPERATIVE CHOLANGIOGRAM
Anesthesia: General | Site: Abdomen

## 2013-10-15 MED ORDER — HYDROMORPHONE HCL 1 MG/ML IJ SOLN
0.2500 mg | INTRAMUSCULAR | Status: DC | PRN
  Administered 2013-10-15 (×2): 0.25 mg via INTRAVENOUS

## 2013-10-15 MED ORDER — FENTANYL CITRATE 0.05 MG/ML IJ SOLN
INTRAMUSCULAR | Status: AC
  Filled 2013-10-15: qty 5

## 2013-10-15 MED ORDER — ONDANSETRON HCL 4 MG/2ML IJ SOLN: 4.0000 mg | Freq: Once | INTRAMUSCULAR | Status: DC | PRN

## 2013-10-15 MED ORDER — NEOSTIGMINE METHYLSULFATE 10 MG/10ML IV SOLN
INTRAVENOUS | Status: DC | PRN
  Administered 2013-10-15: 4 mg via INTRAVENOUS

## 2013-10-15 MED ORDER — PROPOFOL 10 MG/ML IV BOLUS
INTRAVENOUS | Status: DC | PRN
  Administered 2013-10-15: 150 mg via INTRAVENOUS

## 2013-10-15 MED ORDER — MIDAZOLAM HCL 2 MG/2ML IJ SOLN
INTRAMUSCULAR | Status: AC
  Filled 2013-10-15: qty 2

## 2013-10-15 MED ORDER — PROPOFOL 10 MG/ML IV BOLUS
INTRAVENOUS | Status: AC
  Filled 2013-10-15: qty 20

## 2013-10-15 MED ORDER — MIDAZOLAM HCL 5 MG/5ML IJ SOLN
INTRAMUSCULAR | Status: DC | PRN
  Administered 2013-10-15: 2 mg via INTRAVENOUS

## 2013-10-15 MED ORDER — LIDOCAINE HCL (CARDIAC) 20 MG/ML IV SOLN
INTRAVENOUS | Status: AC
  Filled 2013-10-15: qty 10

## 2013-10-15 MED ORDER — FENTANYL CITRATE 0.05 MG/ML IJ SOLN
INTRAMUSCULAR | Status: DC | PRN
  Administered 2013-10-15 (×2): 50 ug via INTRAVENOUS
  Administered 2013-10-15: 100 ug via INTRAVENOUS
  Administered 2013-10-15: 50 ug via INTRAVENOUS

## 2013-10-15 MED ORDER — BUPIVACAINE-EPINEPHRINE (PF) 0.25% -1:200000 IJ SOLN
INTRAMUSCULAR | Status: AC
  Filled 2013-10-15: qty 30

## 2013-10-15 MED ORDER — OXYCODONE-ACETAMINOPHEN 5-325 MG PO TABS
1.0000 | ORAL_TABLET | ORAL | Status: DC | PRN
Start: 2013-10-15 — End: 2016-04-04

## 2013-10-15 MED ORDER — LACTATED RINGERS IV SOLN
INTRAVENOUS | Status: DC
  Administered 2013-10-15 (×2): via INTRAVENOUS

## 2013-10-15 MED ORDER — ONDANSETRON HCL 4 MG/2ML IJ SOLN
INTRAMUSCULAR | Status: AC
  Filled 2013-10-15: qty 2

## 2013-10-15 MED ORDER — PHENYLEPHRINE 40 MCG/ML (10ML) SYRINGE FOR IV PUSH (FOR BLOOD PRESSURE SUPPORT)
PREFILLED_SYRINGE | INTRAVENOUS | Status: AC
  Filled 2013-10-15: qty 10

## 2013-10-15 MED ORDER — ONDANSETRON HCL 4 MG/2ML IJ SOLN
INTRAMUSCULAR | Status: DC | PRN
  Administered 2013-10-15: 4 mg via INTRAVENOUS

## 2013-10-15 MED ORDER — LIDOCAINE HCL (CARDIAC) 20 MG/ML IV SOLN
INTRAVENOUS | Status: DC | PRN
  Administered 2013-10-15: 60 mg via INTRATRACHEAL
  Administered 2013-10-15: 100 mg via INTRAVENOUS

## 2013-10-15 MED ORDER — OXYCODONE HCL 5 MG/5ML PO SOLN: 5.0000 mg | Freq: Once | ORAL | Status: DC | PRN

## 2013-10-15 MED ORDER — SODIUM CHLORIDE 0.9 % IV SOLN
INTRAVENOUS | Status: DC | PRN
  Administered 2013-10-15: 12:00:00

## 2013-10-15 MED ORDER — CHLORHEXIDINE GLUCONATE 4 % EX LIQD
1.0000 "application " | Freq: Once | CUTANEOUS | Status: DC
  Filled 2013-10-15: qty 15

## 2013-10-15 MED ORDER — GLYCOPYRROLATE 0.2 MG/ML IJ SOLN
INTRAMUSCULAR | Status: DC | PRN
  Administered 2013-10-15: 0.6 mg via INTRAVENOUS

## 2013-10-15 MED ORDER — SCOPOLAMINE 1 MG/3DAYS TD PT72
MEDICATED_PATCH | TRANSDERMAL | Status: DC | PRN
  Administered 2013-10-15: 1 via TRANSDERMAL

## 2013-10-15 MED ORDER — DEXAMETHASONE SODIUM PHOSPHATE 4 MG/ML IJ SOLN
INTRAMUSCULAR | Status: AC
  Filled 2013-10-15: qty 1

## 2013-10-15 MED ORDER — OXYCODONE HCL 5 MG PO TABS: 5.0000 mg | ORAL_TABLET | Freq: Once | ORAL | Status: DC | PRN

## 2013-10-15 MED ORDER — OXYCODONE-ACETAMINOPHEN 5-325 MG PO TABS: 1.0000 | ORAL_TABLET | ORAL | Status: DC | PRN

## 2013-10-15 MED ORDER — ARTIFICIAL TEARS OP OINT
TOPICAL_OINTMENT | OPHTHALMIC | Status: AC
  Filled 2013-10-15: qty 3.5

## 2013-10-15 MED ORDER — DIPHENHYDRAMINE HCL 50 MG/ML IJ SOLN
INTRAMUSCULAR | Status: DC | PRN
  Administered 2013-10-15: 12.5 mg via INTRAVENOUS

## 2013-10-15 MED ORDER — HYDROMORPHONE HCL 1 MG/ML IJ SOLN
INTRAMUSCULAR | Status: AC
  Filled 2013-10-15: qty 1

## 2013-10-15 MED ORDER — BUPIVACAINE-EPINEPHRINE 0.25% -1:200000 IJ SOLN
INTRAMUSCULAR | Status: DC | PRN
  Administered 2013-10-15: 15 mL

## 2013-10-15 MED ORDER — GLUCAGON HCL RDNA (DIAGNOSTIC) 1 MG IJ SOLR
INTRAMUSCULAR | Status: AC
  Filled 2013-10-15: qty 1

## 2013-10-15 MED ORDER — ONDANSETRON HCL 4 MG/2ML IJ SOLN
4.0000 mg | INTRAMUSCULAR | Status: DC | PRN
  Filled 2013-10-15: qty 2

## 2013-10-15 MED ORDER — ROCURONIUM BROMIDE 50 MG/5ML IV SOLN
INTRAVENOUS | Status: AC
  Filled 2013-10-15: qty 1

## 2013-10-15 MED ORDER — MORPHINE SULFATE 2 MG/ML IJ SOLN: 2.0000 mg | INTRAMUSCULAR | Status: DC | PRN

## 2013-10-15 MED ORDER — ROCURONIUM BROMIDE 100 MG/10ML IV SOLN
INTRAVENOUS | Status: DC | PRN
  Administered 2013-10-15: 40 mg via INTRAVENOUS

## 2013-10-15 MED ORDER — DEXAMETHASONE SODIUM PHOSPHATE 4 MG/ML IJ SOLN
INTRAMUSCULAR | Status: DC | PRN
  Administered 2013-10-15: 4 mg via INTRAVENOUS

## 2013-10-15 SURGICAL SUPPLY — 45 items
APL SKNCLS STERI-STRIP NONHPOA (GAUZE/BANDAGES/DRESSINGS) ×1
APPLIER CLIP ROT 10 11.4 M/L (STAPLE) ×2
APR CLP MED LRG 11.4X10 (STAPLE) ×1
BAG SPEC RTRVL LRG 6X4 10 (ENDOMECHANICALS) ×1
BENZOIN TINCTURE PRP APPL 2/3 (GAUZE/BANDAGES/DRESSINGS) ×2 IMPLANT
BLADE SURG ROTATE 9660 (MISCELLANEOUS) IMPLANT
CANISTER SUCTION 2500CC (MISCELLANEOUS) ×2 IMPLANT
CHLORAPREP W/TINT 26ML (MISCELLANEOUS) ×2 IMPLANT
CLIP APPLIE ROT 10 11.4 M/L (STAPLE) ×1 IMPLANT
COVER MAYO STAND STRL (DRAPES) ×2 IMPLANT
COVER SURGICAL LIGHT HANDLE (MISCELLANEOUS) ×2 IMPLANT
DRAPE C-ARM 42X72 X-RAY (DRAPES) ×2 IMPLANT
DRAPE UTILITY 15X26 W/TAPE STR (DRAPE) ×4 IMPLANT
DRSG TEGADERM 2-3/8X2-3/4 SM (GAUZE/BANDAGES/DRESSINGS) ×6 IMPLANT
DRSG TEGADERM 4X4.75 (GAUZE/BANDAGES/DRESSINGS) ×2 IMPLANT
ELECT REM PT RETURN 9FT ADLT (ELECTROSURGICAL) ×2
ELECTRODE REM PT RTRN 9FT ADLT (ELECTROSURGICAL) ×1 IMPLANT
FILTER SMOKE EVAC LAPAROSHD (FILTER) ×2 IMPLANT
GAUZE SPONGE 2X2 8PLY NS (GAUZE/BANDAGES/DRESSINGS) ×1 IMPLANT
GAUZE SPONGE 2X2 8PLY STRL LF (GAUZE/BANDAGES/DRESSINGS) ×1 IMPLANT
GLOVE BIO SURGEON STRL SZ7 (GLOVE) ×2 IMPLANT
GLOVE BIOGEL PI IND STRL 7.5 (GLOVE) ×1 IMPLANT
GLOVE BIOGEL PI INDICATOR 7.5 (GLOVE) ×1
GOWN STRL REUS W/ TWL LRG LVL3 (GOWN DISPOSABLE) ×4 IMPLANT
GOWN STRL REUS W/TWL LRG LVL3 (GOWN DISPOSABLE) ×8
KIT BASIN OR (CUSTOM PROCEDURE TRAY) ×2 IMPLANT
KIT ROOM TURNOVER OR (KITS) ×2 IMPLANT
NS IRRIG 1000ML POUR BTL (IV SOLUTION) ×2 IMPLANT
PAD ARMBOARD 7.5X6 YLW CONV (MISCELLANEOUS) ×2 IMPLANT
POUCH SPECIMEN RETRIEVAL 10MM (ENDOMECHANICALS) ×2 IMPLANT
SCISSORS LAP 5X35 DISP (ENDOMECHANICALS) ×2 IMPLANT
SET CHOLANGIOGRAPH 5 50 .035 (SET/KITS/TRAYS/PACK) ×2 IMPLANT
SET IRRIG TUBING LAPAROSCOPIC (IRRIGATION / IRRIGATOR) ×2 IMPLANT
SLEEVE ENDOPATH XCEL 5M (ENDOMECHANICALS) ×2 IMPLANT
SPECIMEN JAR SMALL (MISCELLANEOUS) ×2 IMPLANT
SPONGE GAUZE 2X2 STER 10/PKG (GAUZE/BANDAGES/DRESSINGS) ×2
STRIP CLOSURE SKIN 1/2X4 (GAUZE/BANDAGES/DRESSINGS) ×1 IMPLANT
SUT MNCRL AB 4-0 PS2 18 (SUTURE) ×2 IMPLANT
TOWEL OR 17X24 6PK STRL BLUE (TOWEL DISPOSABLE) ×2 IMPLANT
TOWEL OR 17X26 10 PK STRL BLUE (TOWEL DISPOSABLE) ×2 IMPLANT
TRAY LAPAROSCOPIC (CUSTOM PROCEDURE TRAY) ×2 IMPLANT
TROCAR XCEL BLUNT TIP 100MML (ENDOMECHANICALS) ×2 IMPLANT
TROCAR XCEL NON-BLD 11X100MML (ENDOMECHANICALS) ×2 IMPLANT
TROCAR XCEL NON-BLD 5MMX100MML (ENDOMECHANICALS) ×2 IMPLANT
TUBING INSUFFLATION (TUBING) ×2 IMPLANT

## 2013-10-15 NOTE — Transfer of Care (Signed)
Immediate Anesthesia Transfer of Care Note  Patient: Ashley Eaton  Procedure(s) Performed: Procedure(s): LAPAROSCOPIC CHOLECYSTECTOMY WITH INTRAOPERATIVE CHOLANGIOGRAM (N/A)  Patient Location: PACU  Anesthesia Type:General  Level of Consciousness: awake, alert  and oriented  Airway & Oxygen Therapy: Patient Spontanous Breathing and Patient connected to nasal cannula oxygen  Post-op Assessment: Report given to PACU RN and Post -op Vital signs reviewed and stable  Post vital signs: Reviewed and stable  Complications: No apparent anesthesia complications

## 2013-10-15 NOTE — Anesthesia Procedure Notes (Signed)
Procedure Name: Intubation Date/Time: 10/15/2013 11:08 AM Performed by: Maryland Pink Pre-anesthesia Checklist: Patient identified, Emergency Drugs available, Suction available, Patient being monitored and Timeout performed Patient Re-evaluated:Patient Re-evaluated prior to inductionOxygen Delivery Method: Circle system utilized Preoxygenation: Pre-oxygenation with 100% oxygen Intubation Type: IV induction Ventilation: Mask ventilation without difficulty and Oral airway inserted - appropriate to patient size Laryngoscope Size: Mac and 3 Grade View: Grade II Tube type: Oral Tube size: 7.0 mm Number of attempts: 1 Airway Equipment and Method: Stylet and LTA kit utilized Placement Confirmation: ETT inserted through vocal cords under direct vision,  positive ETCO2 and breath sounds checked- equal and bilateral Secured at: 21 cm Tube secured with: Tape Dental Injury: Teeth and Oropharynx as per pre-operative assessment

## 2013-10-15 NOTE — Op Note (Signed)
Laparoscopic Cholecystectomy with IOC Procedure Note  Indications: This patient presents with symptomatic gallbladder disease and will undergo laparoscopic cholecystectomy.  Pre-operative Diagnosis: Chronic cholecystitis  Post-operative Diagnosis: Same  Surgeon: Nandika Stetzer K.   Assistants: none  Anesthesia: General endotracheal anesthesia  ASA Class: 2  Procedure Details  The patient was seen again in the Holding Room. The risks, benefits, complications, treatment options, and expected outcomes were discussed with the patient. The possibilities of reaction to medication, pulmonary aspiration, perforation of viscus, bleeding, recurrent infection, finding a normal gallbladder, the need for additional procedures, failure to diagnose a condition, the possible need to convert to an open procedure, and creating a complication requiring transfusion or operation were discussed with the patient. The likelihood of improving the patient's symptoms with return to their baseline status is good.  The patient and/or family concurred with the proposed plan, giving informed consent. The site of surgery properly noted. The patient was taken to Operating Room, identified as Ashley Eaton and the procedure verified as Laparoscopic Cholecystectomy with Intraoperative Cholangiogram. A Time Out was held and the above information confirmed.  Prior to the induction of general anesthesia, antibiotic prophylaxis was administered. General endotracheal anesthesia was then administered and tolerated well. After the induction, the abdomen was prepped with Chloraprep and draped in the sterile fashion. The patient was positioned in the supine position.  Local anesthetic agent was injected into the skin near the umbilicus and an incision made. We dissected down to the abdominal fascia with blunt dissection.  The fascia was incised vertically and we entered the peritoneal cavity bluntly.  A pursestring suture of 0-Vicryl was  placed around the fascial opening.  The Hasson cannula was inserted and secured with the stay suture.  Pneumoperitoneum was then created with CO2 and tolerated well without any adverse changes in the patient's vital signs. An 11-mm port was placed in the subxiphoid position.  Two 5-mm ports were placed in the right upper quadrant. All skin incisions were infiltrated with a local anesthetic agent before making the incision and placing the trocars.   We positioned the patient in reverse Trendelenburg, tilted slightly to the patient's left.  The gallbladder was identified, the fundus grasped and retracted cephalad. Adhesions were lysed bluntly and with the electrocautery where indicated, taking care not to injure any adjacent organs or viscus. The infundibulum was grasped and retracted laterally, exposing the peritoneum overlying the triangle of Calot. This was then divided and exposed in a blunt fashion. A critical view of the cystic duct and cystic artery was obtained.  The cystic duct was clearly identified and bluntly dissected circumferentially. The cystic duct was ligated with a clip distally.   An incision was made in the cystic duct and the [facility] cholangiogram catheter introduced. The catheter was secured using a clip. A cholangiogram was then obtained which showed good visualization of the distal and proximal biliary tree with no sign of filling defects or obstruction.  Contrast flowed easily into the duodenum. The catheter was then removed.   The cystic duct was then ligated with clips and divided. The cystic artery was identified, dissected free, ligated with clips and divided as well.   The gallbladder was dissected from the liver bed in retrograde fashion with the electrocautery. The gallbladder was removed and placed in an Endocatch sac. The liver bed was irrigated and inspected. Hemostasis was achieved with the electrocautery. Copious irrigation was utilized and was repeatedly aspirated until clear.   The gallbladder and Endocatch sac were then  removed through the umbilical port site.  The pursestring suture was used to close the umbilical fascia.    We again inspected the right upper quadrant for hemostasis.  Pneumoperitoneum was released as we removed the trocars.  4-0 Monocryl was used to close the skin.   Benzoin, steri-strips, and clean dressings were applied. The patient was then extubated and brought to the recovery room in stable condition. Instrument, sponge, and needle counts were correct at closure and at the conclusion of the case.   Findings: Cholecystitis without Cholelithiasis  Estimated Blood Loss: Minimal         Drains: none         Specimens: Gallbladder           Complications: None; patient tolerated the procedure well.         Disposition: PACU - hemodynamically stable.         Condition: stable  Imogene Burn. Georgette Dover, MD, [facility] Surgery  General/ Trauma Surgery  10/15/2013 11:58 AM

## 2013-10-15 NOTE — H&P (Signed)
History of Present Illness Ashley Eaton. Ashley Massett MD; 09/11/2013 4:51 PM) Patient words: gallbladder.  The patient is a 53 year old female who presents with abdominal pain. Symptoms include abdominal pain (Right upper quadrant with radiation around the right side of her back) and nausea. The pain is located in the right upper quadrant. The pain radiates to the right flank. The patient describes the pain as crampy and dull. Onset was sudden 2 month(s) ago. The symptoms occur intermittently. The episodes occur daily. The patient describes this as severe and worsening. Symptoms are exacerbated by eating and fatty foods. Past evaluation has included complete blood count, liver enzymes and abdominal ultrasound (and HIDA scan).  Note:08/06/13  CBC WBC 8.3 Hgb 13.9 Plts 266  LFT's WNL Lipase/ Amylase normal  [place] 08/06/13 - no gallstones or wall thickening visualized. no pericholecystic fluid. + sonographic Murphy's sign, nl CBD  HIDA scan 08/08/13 - no filling; EF 36%. Significant symptoms with CCK infusion.   Other Problems Ashley Eaton [place], Ashley Eaton; 09/11/2013 3:55 PM) Back Pain Cancer Gastroesophageal Reflux Disease General anesthesia - complications Sleep Apnea  Past Surgical History (Ashley Eaton, Ashley Eaton; 09/11/2013 3:55 PM) Foot Surgery Bilateral.  Diagnostic Studies History (Ashley Eaton, Ashley Eaton; 09/11/2013 3:55 PM) Mammogram within last year Pap Smear 1-5 years ago  Allergies (Ashley Eaton, Ashley Eaton; 09/11/2013 3:57 PM) PredniSONE *CORTICOSTEROIDS*  Medication History (Ashley Eaton, Ashley Eaton; 09/11/2013 3:59 PM) Aspirin EC (81MG  Tablet DR, Oral) Active. Vitamin B 12 (50MCG Tablet, Oral) Active. Glucosamine HCl (500MG  Tablet, Oral) Active. Multivitamin (Oral) Active. Nitrostat (0.4MG  Tab Sublingual, Sublingual) Active. Medications Reconciled  Social History Ashley Eaton [place], Ashley Eaton; 09/11/2013 3:55 PM) Alcohol use Occasional alcohol use. Caffeine use  Carbonated beverages, Coffee. No drug use Tobacco use Never smoker.  Family History (Ashley Eaton, Ashley Eaton; 09/11/2013 3:55 PM) Cerebrovascular Accident Father. Diabetes Mellitus Mother, Sister. Heart Disease Mother. Heart disease in female family member before age 40 Kidney Disease Father. Seizure disorder Father.  Pregnancy / Birth History Ashley Eaton Ashley Eaton, Ashley Eaton; 09/11/2013 3:55 PM) Age at menarche 31 years. Age of menopause <47 Contraceptive History Intrauterine device. Gravida 5 Irregular periods Maternal age 25-30 Para 3  Review of Systems Ashley Eaton. Ashley Majano MD; 09/11/2013 4:49 PM) General Present- Fatigue and Night Sweats. Not Present- Appetite Loss, Chills, Fever, Weight Gain and Weight Loss. Skin Present- Change in Wart/Mole. Not Present- Dryness, Hives, Jaundice, New Lesions, Non-Healing Wounds, Rash and Ulcer. HEENT Present- Oral Ulcers. Not Present- Earache, Hearing Loss, Hoarseness, Nose Bleed, Ringing in the Ears, Seasonal Allergies, Sinus Pain, Sore Throat, Visual Disturbances, Wears glasses/contact lenses and Yellow Eyes. Breast Not Present- Breast Mass, Breast Pain, Nipple Discharge and Skin Changes. Cardiovascular Present- Leg Cramps. Not Present- Chest Pain, Difficulty Breathing Lying Down, Palpitations, Rapid Heart Rate, Shortness of Breath and Swelling of Extremities. Gastrointestinal Present- Abdominal Pain, Bloating and Nausea. Not Present- Bloody Stool, Change in Bowel Habits, Chronic diarrhea, Constipation, Difficulty Swallowing, Excessive gas, Gets full quickly at meals, Hemorrhoids, Indigestion, Rectal Pain and Vomiting. Female Genitourinary Present- Nocturia. Not Present- Frequency, Painful Urination, Pelvic Pain and Urgency. Musculoskeletal Present- Joint Stiffness. Not Present- Back Pain, Joint Pain, Muscle Pain, Muscle Weakness and Swelling of Extremities. Neurological Present- Numbness. Not Present- Decreased Memory, Fainting, Headaches,  Seizures, Tingling, Tremor, Trouble walking and Weakness. Psychiatric Present- Change in Sleep Pattern. Not Present- Anxiety, Bipolar, Depression, Fearful and Frequent crying. Endocrine Present- Hot flashes. Not Present- Cold Intolerance, Excessive Hunger, Hair Changes, Heat Intolerance and New Diabetes.   Vitals (Ashley Eaton Ashley Eaton; 09/11/2013 4:03 PM) 09/11/2013 4:00 PM Weight: 177  lb Height: 61.5in Body Surface Area: 1.87 m Body Mass Index: 32.9 kg/m Temp.: 97.71F(Oral)  Pulse: 109 (Regular)  BP: 106/80 (Sitting, Right Arm, Standard)    Physical Exam Ashley Key K. Jayde Daffin MD; 09/11/2013 4:49 PM) The physical exam findings are as follows: Note:WDWN in NAD HEENT: EOMI, sclera anicteric Neck: No masses, no thyromegaly Lungs: CTA bilaterally; normal respiratory effort CV: Regular rate and rhythm; no murmurs Abd: +bowel sounds, soft, tender in RUQ; no palpable masses Ext: Well-perfused; no edema Skin: Warm, dry; no sign of jaundice    Assessment & Plan Ashley Key K. Ashley Galen MD; 09/11/2013 4:53 PM) CHRONIC CHOLECYSTITIS WITHOUT CALCULUS (575.11  K81.1) Current Plans  Schedule for Surgery Note:Laparoscopic cholecystectomy with intraoperative cholangiogram. The surgical procedure has been discussed with the patient. Potential risks, benefits, alternative treatments, and expected outcomes have been explained. All of the patient's questions at this time have been answered. The likelihood of reaching the patient's treatment goal is good. The patient understand the proposed surgical procedure and wishes to proceed.  Ashley Eaton. Ashley Dover, MD, [facility] Surgery  General/ Trauma Surgery  10/15/2013 9:19 AM

## 2013-10-15 NOTE — Discharge Instructions (Signed)
[facility] °LAPAROSCOPIC SURGERY: POST OP INSTRUCTIONS °Always review your discharge instruction sheet given to you by the facility where your surgery was performed. °IF YOU HAVE DISABILITY OR FAMILY LEAVE FORMS, YOU MUST BRING THEM TO THE OFFICE FOR PROCESSING.   °DO NOT GIVE THEM TO YOUR DOCTOR. ° °1. A prescription for pain medication will be given to you upon discharge.  Take your pain medication as prescribed, if needed.  If narcotic pain medicine is not needed, then you may take acetaminophen (Tylenol) or ibuprofen (Advil) as needed. °2. Take your usually prescribed medications unless otherwise directed. °3. If you need a refill on your pain medication, please contact your pharmacy.  They will contact our office to request authorization. Prescriptions will not be filled after 5pm or on week-ends. °4. You should follow a light diet the first few days after arrival home, such as soup and crackers, etc.  Be sure to include lots of fluids daily. °5. Most patients will experience some swelling and bruising in the area of the incisions.  Ice packs will help.  Swelling and bruising can take several days to resolve.  °6. It is common to experience some constipation if taking pain medication after surgery.  Increasing fluid intake and taking a stool softener (such as Colace) will usually help or prevent this problem from occurring.  A mild laxative (Milk of Magnesia or Miralax) should be taken according to package instructions if there are no bowel movements after 48 hours. °7. Unless discharge instructions indicate otherwise, you may remove your bandages 48 hours after surgery, and you may shower at that time.  You will have steri-strips (small skin tapes) in place directly over the incision.  These strips should be left on the skin for 7-10 days.  If your surgeon used skin glue on the incision, you may shower in 24 hours.  The glue will flake off over the next 2-3 weeks.  Any sutures or staples  will be removed at the office during your follow-up visit. °8. ACTIVITIES:  You may resume regular (light) daily activities beginning the next day--such as daily self-care, walking, climbing stairs--gradually increasing activities as tolerated.  You may have sexual intercourse when it is comfortable.  Refrain from any heavy lifting or straining until approved by your doctor. °a. You may drive when you are no longer taking prescription pain medication, you can comfortably wear a seatbelt, and you can safely maneuver your car and apply brakes. °b. RETURN TO WORK:   2-3 weeks °9. You should see your doctor in the office for a follow-up appointment approximately 2-3 weeks after your surgery.  Make sure that you call for this appointment within a day or two after you arrive home to insure a convenient appointment time. °10. OTHER INSTRUCTIONS: ________________________________________________________________________ °WHEN TO CALL YOUR DOCTOR: °1. Fever over 101.0 °2. Inability to urinate °3. Continued bleeding from incision. °4. Increased pain, redness, or drainage from the incision. °5. Increasing abdominal pain ° °The clinic staff is available to answer your questions during regular business hours.  Please don’t hesitate to call and ask to speak to one of the nurses for clinical concerns.  If you have a medical emergency, go to the nearest emergency room or call 911.  A surgeon from [facility] is always on call at the hospital. °[address], [address], [place], [place]  [address] ? [address] [address], [place], [place]   [address] °[phone] ? [phone] ? FAX [phone] °Web site:   [email or web address]   What to eat:  For your first meals, you should eat lightly; only small meals initially.  If you do not have nausea, you may eat larger meals.  Avoid spicy, greasy and heavy food.    General Anesthesia, Adult, Care After  Refer to this sheet in the next few weeks. These  instructions provide you with information on caring for yourself after your procedure. Your health care provider may also give you more specific instructions. Your treatment has been planned according to current medical practices, but problems sometimes occur. Call your health care provider if you have any problems or questions after your procedure.  WHAT TO EXPECT AFTER THE PROCEDURE  After the procedure, it is typical to experience:  Sleepiness.  Nausea and vomiting. HOME CARE INSTRUCTIONS  For the first 24 hours after general anesthesia:  Have a responsible person with you.  Do not drive a car. If you are alone, do not take public transportation.  Do not drink alcohol.  Do not take medicine that has not been prescribed by your health care provider.  Do not sign important papers or make important decisions.  You may resume a normal diet and activities as directed by your health care provider.  Change bandages (dressings) as directed.  If you have questions or problems that seem related to general anesthesia, call the hospital and ask for the anesthetist or anesthesiologist on call. SEEK MEDICAL CARE IF:  You have nausea and vomiting that continue the day after anesthesia.  You develop a rash. SEEK IMMEDIATE MEDICAL CARE IF:  You have difficulty breathing.  You have chest pain.  You have any allergic problems. Document Released: 03/28/2000 Document Revised: 08/22/2012 Document Reviewed: 07/05/2012  [facility] Patient Information 2014 [place], [place].

## 2013-10-15 NOTE — Anesthesia Preprocedure Evaluation (Signed)
Anesthesia Evaluation  Patient identified by MRN, date of birth, ID band Patient awake    Reviewed: Allergy & Precautions, H&P , NPO status , Patient's Chart, lab work & pertinent test results  History of Anesthesia Complications (+) PONV  Airway Mallampati: II TM Distance: >3 FB Neck ROM: Full    Dental  (+) Teeth Intact, Dental Advisory Given   Pulmonary neg pulmonary ROS,    Pulmonary exam normal       Cardiovascular negative cardio ROS      Neuro/Psych negative neurological ROS  negative psych ROS   GI/Hepatic negative GI ROS, Neg liver ROS,   Endo/Other  negative endocrine ROS  Renal/GU negative Renal ROS     Musculoskeletal   Abdominal   Peds  Hematology   Anesthesia Other Findings   Reproductive/Obstetrics                           Anesthesia Physical Anesthesia Plan  ASA: II  Anesthesia Plan: General   Post-op Pain Management:    Induction: Intravenous  Airway Management Planned: Oral ETT  Additional Equipment:   Intra-op Plan:   Post-operative Plan: Extubation in OR  Informed Consent: I have reviewed the patients History and Physical, chart, labs and discussed the procedure including the risks, benefits and alternatives for the proposed anesthesia with the patient or authorized representative who has indicated his/her understanding and acceptance.   Dental advisory given  Plan Discussed with: Anesthesiologist and Surgeon  Anesthesia Plan Comments:         Anesthesia Quick Evaluation

## 2013-10-15 NOTE — Anesthesia Postprocedure Evaluation (Signed)
  Anesthesia Post-op Note  Patient: Ashley Eaton  Procedure(s) Performed: Procedure(s): LAPAROSCOPIC CHOLECYSTECTOMY WITH INTRAOPERATIVE CHOLANGIOGRAM (N/A)  Patient Location: PACU  Anesthesia Type: No value filed.   Level of Consciousness: awake, alert  and oriented  Airway and Oxygen Therapy: Patient Spontanous Breathing  Post-op Pain: mild  Post-op Assessment: Post-op Vital signs reviewed  Post-op Vital Signs: Reviewed  Last Vitals:  Filed Vitals:   10/15/13 1254  BP: 84/40  Pulse: 50  Temp:   Resp: 15    Complications: No apparent anesthesia complications

## 2013-10-15 NOTE — Progress Notes (Signed)
Pt bp decreased to 80s/40s. HR =40s-50s. Pt still arousable and oriented. Dr.Crews notified. 500cc IVFB ordered. Will give and cont to monitor closely

## 2013-10-16 ENCOUNTER — Encounter (HOSPITAL_COMMUNITY): Payer: Self-pay | Admitting: Surgery

## 2014-02-18 ENCOUNTER — Ambulatory Visit: Payer: Managed Care, Other (non HMO) | Admitting: Cardiology

## 2014-04-21 ENCOUNTER — Ambulatory Visit (INDEPENDENT_AMBULATORY_CARE_PROVIDER_SITE_OTHER): Payer: Managed Care, Other (non HMO) | Admitting: Cardiology

## 2014-04-21 ENCOUNTER — Encounter: Payer: Self-pay | Admitting: Cardiology

## 2014-04-21 VITALS — BP 130/96 | HR 79 | Ht 61.0 in | Wt 180.2 lb

## 2014-04-21 DIAGNOSIS — R079 Chest pain, unspecified: Secondary | ICD-10-CM

## 2014-04-21 DIAGNOSIS — E876 Hypokalemia: Secondary | ICD-10-CM | POA: Diagnosis not present

## 2014-04-21 DIAGNOSIS — E785 Hyperlipidemia, unspecified: Secondary | ICD-10-CM | POA: Diagnosis not present

## 2014-04-21 LAB — CBC WITH DIFFERENTIAL/PLATELET
Basophils Absolute: 0 10*3/uL (ref 0.0–0.1)
Basophils Relative: 0.4 % (ref 0.0–3.0)
Eosinophils Absolute: 0.2 10*3/uL (ref 0.0–0.7)
Eosinophils Relative: 3.6 % (ref 0.0–5.0)
HCT: 39.3 % (ref 36.0–46.0)
Hemoglobin: 13.3 g/dL (ref 12.0–15.0)
Lymphocytes Relative: 29.6 % (ref 12.0–46.0)
Lymphs Abs: 1.5 10*3/uL (ref 0.7–4.0)
MCHC: 33.9 g/dL (ref 30.0–36.0)
MCV: 93.8 fl (ref 78.0–100.0)
Monocytes Absolute: 0.4 10*3/uL (ref 0.1–1.0)
Monocytes Relative: 7.6 % (ref 3.0–12.0)
Neutro Abs: 3 10*3/uL (ref 1.4–7.7)
Neutrophils Relative %: 58.8 % (ref 43.0–77.0)
Platelets: 293 10*3/uL (ref 150.0–400.0)
RBC: 4.18 Mil/uL (ref 3.87–5.11)
RDW: 12.6 % (ref 11.5–15.5)
WBC: 5.2 10*3/uL (ref 4.0–10.5)

## 2014-04-21 LAB — COMPREHENSIVE METABOLIC PANEL
ALT: 27 U/L (ref 0–35)
AST: 20 U/L (ref 0–37)
Albumin: 4.2 g/dL (ref 3.5–5.2)
Alkaline Phosphatase: 69 U/L (ref 39–117)
BUN: 14 mg/dL (ref 6–23)
CO2: 26 mEq/L (ref 19–32)
Calcium: 9.7 mg/dL (ref 8.4–10.5)
Chloride: 107 mEq/L (ref 96–112)
Creatinine, Ser: 0.85 mg/dL (ref 0.40–1.20)
GFR: 74.8 mL/min (ref >60.00)
Glucose, Bld: 88 mg/dL (ref 70–99)
Potassium: 4 mEq/L (ref 3.5–5.1)
Sodium: 139 mEq/L (ref 135–145)
Total Bilirubin: 0.3 mg/dL (ref 0.2–1.2)
Total Protein: 7.2 g/dL (ref 6.0–8.3)

## 2014-04-21 LAB — TSH: TSH: 2.45 u[IU]/mL (ref 0.35–4.50)

## 2014-04-21 NOTE — Patient Instructions (Signed)
Medication Instructions:   Your physician recommends that you continue on your current medications as directed. Please refer to the Current Medication list given to you today.   Labwork:  TODAY--  CMET, CBC W DIFF, TSH, NMR W LIPIDS    Follow-Up:  Your physician wants you to follow-up in: [place] will receive a reminder letter in the mail two months in advance. If you don't receive a letter, please call our office to schedule the follow-up appointment.

## 2014-04-21 NOTE — Progress Notes (Signed)
Patient ID: Ashley Eaton, female   DOB: June 14, 1960, 54 y.o.   MRN: [id number]    Patient Name: Ashley Eaton Date of Encounter: 04/21/2014  Primary Care Provider:  Stephens Shire, MD Primary Cardiologist:  Dorothy Spark  Problem List   Past Medical History  Diagnosis Date  . Complication of anesthesia   . PONV (postoperative nausea and vomiting)   . Neuromuscular disorder     Morton's neuroma, plantar fasciitis s/p 10  . Cancer 06?    vulva   Past Surgical History  Procedure Laterality Date  . Foot surgery Bilateral 2010,2011    For Plantar Faciitis  . Foot surgery Bilateral 1992,95    For Morton's Neuroma  . Cervical conization w/bx  06  . Diagnostic laparoscopy      endometriosis- 54 yrs old  . Cholecystectomy N/A 10/15/2013    Procedure: LAPAROSCOPIC CHOLECYSTECTOMY WITH INTRAOPERATIVE CHOLANGIOGRAM;  Surgeon: Donnie Mesa, MD;  Location: [facility];  Service: General;  Laterality: N/A;    Allergies  Allergies  Allergen Reactions  . Other Other (See Comments)    Synthetic pain medication makes her heart race ?name  . Prednisone Other (See Comments)    tablet form, upset stomach, states it makes her crazy   HPI  54 y.o. female who presented to the ED with complaints of chest pain in the center of her chest that radiates across her chest and at times also radiates into her shoulders and jaw. She rated the pain at a 8/10 and was constant and lasted 12 hours until she had been given Aspirin in the ED. She reports taking 2 Baby Aspirin at home without relief. She reports also having nausea and diaphoresis associated with the pain but denies having any SOB. She was evaluated in the ED and found to have an initial negative troponin and a normal EKG and was referred for further evaluation. Since the discharge in the ED she has had two more of those episodes, but much milder and of shorter duration. She denies palpitations or syncope. Her mother had multiple MIs and CHF in  her 89'. She has never smoked.  04/21/14 - the patient is coming after 1 year, she continues to perform strenuous work, no limitations. She ran a few 5K runs with no problem. Continues to have atypical left axillar pain at rest. She sticks to a healthy diet. No palpitations, syncope, no LE edema, orthopnea.  Home Medications  Prior to Admission medications   Medication Sig Start Date End Date Taking? Authorizing Provider  aspirin 81 MG tablet Take 81 mg by mouth daily.   Yes Historical Provider, MD  Cyanocobalamin (B-12) 500 MCG SUBL Place under the tongue as directed.   Yes Historical Provider, MD  GLUCOSAMINE-CHONDROITIN PO Take 1 tablet by mouth daily.   Yes Historical Provider, MD  GREEN COFFEE BEAN PO Take 1 tablet by mouth daily.   Yes Historical Provider, MD  Multiple Vitamin (MULTIVITAMIN) capsule Take 4 capsules by mouth daily.   Yes Historical Provider, MD    Family History  Family History  Problem Relation Age of Onset  . Heart failure Mother   . Diabetes Mother   . Cancer - Other Father     Renal Cell    Social History  History   Social History  . Marital Status: Married    Spouse Name: N/A  . Number of Children: N/A  . Years of Education: N/A   Occupational History  . Not on file.  Social History Main Topics  . Smoking status: Never Smoker   . Smokeless tobacco: Not on file  . Alcohol Use: 3.0 - 4.2 oz/week    5-7 Glasses of wine per week  . Drug Use: No  . Sexual Activity: Yes    Birth Control/ Protection: IUD   Other Topics Concern  . Not on file   Social History Narrative     Review of Systems, as per HPI, otherwise negative General:  No chills, fever, night sweats or weight changes.  Cardiovascular:  No chest pain, dyspnea on exertion, edema, orthopnea, palpitations, paroxysmal nocturnal dyspnea. Dermatological: No rash, lesions/masses Respiratory: No cough, dyspnea Urologic: No hematuria, dysuria Abdominal:   No nausea, vomiting, diarrhea,  bright red blood per rectum, melena, or hematemesis Neurologic:  No visual changes, wkns, changes in mental status. All other systems reviewed and are otherwise negative except as noted above.  Physical Exam  Blood pressure 130/96, pulse 79, height $RemoveBe'5\' 1"'nezMeAuyO$  (1.549 m), weight 180 lb 3.2 oz (81.738 kg).  General: Pleasant, NAD Psych: Normal affect. Neuro: Alert and oriented X 3. Moves all extremities spontaneously. HEENT: Normal  Neck: Supple without bruits or JVD. Lungs:  Resp regular and unlabored, CTA. Heart: RRR no s3, s4, or murmurs. Abdomen: Soft, non-tender, non-distended, BS + x 4.  Extremities: No clubbing, cyanosis or edema. DP/PT/Radials 2+ and equal bilaterally.  Labs:  No results for input(s): CKTOTAL, CKMB, TROPONINI in the last 72 hours. Lab Results  Component Value Date   WBC 5.6 10/14/2013   HGB 13.6 10/14/2013   HCT 39.3 10/14/2013   MCV 92.5 10/14/2013   PLT 265 10/14/2013   No results for input(s): NA, K, CL, CO2, BUN, CREATININE, CALCIUM, PROT, BILITOT, ALKPHOS, ALT, AST, GLUCOSE in the last 168 hours.  Invalid input(s): LABALBU Lab Results  Component Value Date   CHOL 185 11/10/2012   HDL 53 11/10/2012   LDLCALC 109* 11/10/2012   TRIG 114 11/10/2012   Lab Results  Component Value Date   DDIMER <0.27 11/09/2012   Invalid input(s): POCBNP  Accessory Clinical Findings  echocardiogram  ECG - normal sinus rhythm, 66 beats per minute normal EKG.  Lipid Panel     Component Value Date/Time   CHOL 185 11/10/2012 0550   TRIG 114 11/10/2012 0550   HDL 53 11/10/2012 0550   CHOLHDL 3.5 11/10/2012 0550   VLDL 23 11/10/2012 0550   LDLCALC 109* 11/10/2012 0550    Exercise treadmill stress test 12/13/2012 Impression: Negative for ischemia GXT with the patient exercising to an 11.7 met workload and a HR of 164, representing 96% APMHR. Normal BP response to exercise. No chest pain or ischemic ECG changes. Confirmed by Claiborne Billings MD, THOMAS [phone]) on 12/13/2012  5:37:26 PM    Assessment & Plan  54 year female   1. Chest pain - typical in character but not exertional, coronary CT was not approved by insurance. The patient underwent exercise treadmill stress test and exercises for 10 minutes, achieved 11.7 METS with no symptoms. No ischemia on ECG.  She didn't need to use NTG in the interim. ECG today is normal and unchanged from the last year.   2. Blood pressure - rechecked was controlled  3. Hyperlipidemia - mildly elevated LDL, lifestyle changes. The patient was advised about proper type and frequency/time of exercise (minimum 30 minutes 5 x per week). We will recheck today.   Follow up in 1 year with lipid profile and CMP.   Dorothy Spark, MD, [facility]  04/21/2014, 9:43 AM

## 2014-04-23 LAB — NMR LIPOPROFILE WITH LIPIDS
Cholesterol, Total: 198 mg/dL (ref 100–199)
HDL Particle Number: 31.1 umol/L (ref >=30.5)
HDL Size: 9.1 nm — ABNORMAL LOW (ref >=9.2)
HDL-C: 54 mg/dL (ref >39)
LDL (calc): 126 mg/dL — ABNORMAL HIGH (ref 0–99)
LDL Particle Number: 1552 nmol/L — ABNORMAL HIGH (ref <1000)
LDL Size: 21.3 nm (ref >=20.8)
LP-IR Score: 31 (ref <=45)
Large HDL-P: 6.7 umol/L (ref >=4.8)
Large VLDL-P: 1.7 nmol/L (ref <=2.7)
Small LDL Particle Number: 583 nmol/L — ABNORMAL HIGH (ref <=527)
Triglycerides: 88 mg/dL (ref 0–149)
VLDL Size: 43.1 nm (ref <=46.6)

## 2014-04-24 ENCOUNTER — Telehealth: Payer: Self-pay | Admitting: *Deleted

## 2014-04-24 NOTE — Telephone Encounter (Signed)
Contacted the pt to inform her that per Dr Meda Coffee her LDL is elevated, TG and HDL are normal, and she recommends the pt start taking OTC red yeast rice for now and work on her diet.  Pt verbalized understanding and agrees with this plan.

## 2014-04-24 NOTE — Telephone Encounter (Signed)
-----   Message from Dorothy Spark, MD sent at 04/24/2014 12:26 AM EDT ----- The patient has elevated LDL, TG and HDL are normal. She should start taking OTC red yeast rice for now and work on her diet.

## 2014-11-07 ENCOUNTER — Encounter: Payer: Self-pay | Admitting: Obstetrics & Gynecology

## 2014-12-04 HISTORY — PX: COLONOSCOPY: SHX174

## 2015-05-07 ENCOUNTER — Encounter: Payer: Managed Care, Other (non HMO) | Admitting: Podiatry

## 2015-05-14 ENCOUNTER — Ambulatory Visit: Payer: Managed Care, Other (non HMO) | Admitting: Podiatry

## 2015-05-21 ENCOUNTER — Ambulatory Visit: Payer: Managed Care, Other (non HMO) | Admitting: Podiatry

## 2015-06-04 NOTE — Progress Notes (Signed)
This encounter was created in error - please disregard.

## 2015-07-23 ENCOUNTER — Ambulatory Visit: Payer: Managed Care, Other (non HMO) | Admitting: Podiatry

## 2016-04-04 ENCOUNTER — Encounter: Payer: Self-pay | Admitting: Allergy and Immunology

## 2016-04-04 ENCOUNTER — Ambulatory Visit (INDEPENDENT_AMBULATORY_CARE_PROVIDER_SITE_OTHER): Payer: BC Managed Care – PPO | Admitting: Allergy and Immunology

## 2016-04-04 VITALS — BP 118/82 | HR 80 | Temp 97.6°F | Resp 16 | Ht 61.5 in | Wt 183.0 lb

## 2016-04-04 DIAGNOSIS — R059 Cough, unspecified: Secondary | ICD-10-CM

## 2016-04-04 DIAGNOSIS — K219 Gastro-esophageal reflux disease without esophagitis: Secondary | ICD-10-CM

## 2016-04-04 DIAGNOSIS — J3089 Other allergic rhinitis: Secondary | ICD-10-CM | POA: Diagnosis not present

## 2016-04-04 DIAGNOSIS — R05 Cough: Secondary | ICD-10-CM

## 2016-04-04 MED ORDER — METHYLPREDNISOLONE ACETATE 80 MG/ML IJ SUSP
80.0000 mg | Freq: Once | INTRAMUSCULAR | Status: AC
  Administered 2016-04-04: 80 mg via INTRAMUSCULAR

## 2016-04-04 MED ORDER — OMEPRAZOLE 40 MG PO CPDR: DELAYED_RELEASE_CAPSULE | ORAL | 5 refills | Status: DC

## 2016-04-04 MED ORDER — RANITIDINE HCL 300 MG PO TABS
ORAL_TABLET | ORAL | 5 refills | Status: DC
Start: 2016-04-04 — End: 2017-04-17

## 2016-04-04 NOTE — Patient Instructions (Addendum)
  1. Allergen avoidance measures  2. Treat and prevent inflammation:   A. continue Flonase one-2 sprays each nostril one time per day  B. continue montelukast 10 mg tablet 1 time per day  C. Depo-Medrol 80 IM delivered in clinic today  3. Treat and prevent reflux:   A. consolidate all caffeine consumption  B. start omeprazole 40 mg in a.m.  C. start ranitidine 300 mg in PM  D. head of bed elevated 4-6 inches  E. no late meals  4. Replace all throat clearing with swallowing maneuver  5. If needed:    A. OTC antihistamine  B. OTC nasal saline   6. Review most recent chest x-ray report  7. Return to clinic in 3 weeks or earlier if problem

## 2016-04-04 NOTE — Progress Notes (Signed)
Dear Dr. Tollie Pizza,  Thank you for referring Ashley Eaton to the [facility] on 04/04/2016.   Below is a summation of this patient's evaluation and recommendations.  Thank you for your referral. I will keep you informed about this patient's response to treatment.   If you have any questions please do not hesitate to contact me.   Sincerely,  Jiles Prows, MD Allergy / Immunology [facility]   ______________________________________________________________________    NEW PATIENT NOTE  Referring Provider: Stephens Shire, MD Primary Provider: Heywood Bene, PA-C Date of office visit: 04/04/2016    Subjective:   Chief Complaint:  Ashley Eaton (DOB: April 04, 1960) is a 56 y.o. female who presents to the clinic on 04/04/2016 with a chief complaint of Sinus Problem; Ear Problem; and Cough .     HPI: Ashley Eaton presents to this clinic in evaluation of cough. Apparently since she contracted influenza back in November 2017 she's had an unrelenting cough since that point in time. Her cough is associated with gagging and retching and posttussive emesis and micturition. She has constant throat clearing and postnasal drip and something that's difficult to get out of her throat and intermittent raspy voice. In addition, she occasionally does have heartburn up into her throat usually averaging out to about 1 time per week and this may be a little bit worse since she's had this cough. Apparently she had a chest x-ray November which was normal.  Prior to this point in time she did develop some intermittent respiratory tract issues but this was only several times per year and was relatively short lived. She does have a history of decreased ability to smell of many years duration and she has a history of intermittent right ear popping of long-standing duration and occasionally develops some issues with  intermittent nasal congestion and very slight sneezing.  She denies any chest tightness or shortness of breath but she has had an episode of chest pain that has required her to get a cardiac evaluation this winter.  She drinks an energy drink every morning and has several glasses of tea through the day and rarely has chocolate and rarely has alcohol.  Past Medical History:  Diagnosis Date  . Cancer (HCC) 06?   vulva  . Complication of anesthesia   . Neuromuscular disorder ([place])    Morton's neuroma, plantar fasciitis s/p 10  . PONV (postoperative nausea and vomiting)     Past Surgical History:  Procedure Laterality Date  . CERVICAL CONIZATION W/BX  06  . CHOLECYSTECTOMY N/A 10/15/2013   Procedure: LAPAROSCOPIC CHOLECYSTECTOMY WITH INTRAOPERATIVE CHOLANGIOGRAM;  Surgeon: Donnie Mesa, MD;  Location: [facility];  Service: General;  Laterality: N/A;  . DIAGNOSTIC LAPAROSCOPY     endometriosis- 56 yrs old  . FOOT SURGERY Bilateral 2010,2011   For Plantar Faciitis  . FOOT SURGERY Bilateral 1992,95   For Morton's Neuroma    Allergies as of 04/04/2016      Reactions   Amoxicillin-pot Clavulanate Other (See Comments)   Other Other (See Comments)   Synthetic pain medication makes her heart race ?name   Prednisone Other (See Comments)   tablet form, upset stomach, states it makes her crazy      Medication List      fluticasone 50 MCG/ACT nasal spray Commonly known as:  FLONASE 2 sprays by Each Nare route daily.   montelukast 10 MG tablet Commonly  known as:  SINGULAIR Take 10 mg by mouth.   multivitamin with minerals Tabs tablet Take 1 tablet by mouth daily.   VITAMIN B-12 PO Take by mouth.       Review of systems negative except as noted in HPI / PMHx or noted below:  Review of Systems  Constitutional: Negative.   HENT: Negative.   Eyes: Negative.   Respiratory: Negative.   Cardiovascular: Negative.   Gastrointestinal: Negative.   Genitourinary: Negative.     Musculoskeletal: Negative.   Skin: Negative.   Neurological: Negative.   Endo/Heme/Allergies: Negative.   Psychiatric/Behavioral: Negative.     Family History  Problem Relation Age of Onset  . Heart failure Mother   . Diabetes Mother   . Cancer - Other Father     Renal Cell  . Asthma Sister   . Asthma Daughter   . Asthma Sister     Social History   Social History  . Marital status: Married    Spouse name: N/A  . Number of children: N/A  . Years of education: N/A   Occupational History  . Not on file.   Social History Main Topics  . Smoking status: Never Smoker  . Smokeless tobacco: Never Used  . Alcohol use 3.0 - 4.2 oz/week    5 - 7 Glasses of wine per week  . Drug use: No  . Sexual activity: Yes    Birth control/ protection: IUD   Other Topics Concern  . Not on file   Social History Narrative  . No narrative on file    Environmental and Social history  Lives in a house with a dry environment, a dog located inside the household, carpeting in the bedroom, no plastic on the bed or pillow, and no smoking ongoing with inside the household. 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  Objective:   Vitals:   04/04/16 1329  BP: 118/82  Pulse: 80  Resp: 16  Temp: 97.6 F (36.4 C)   Height: 5' 1.5" (156.2 cm) Weight: 183 lb (83 kg)  Physical Exam  Constitutional: She is well-developed, well-nourished, and in no distress.  Constant throat clearing  HENT:  Head: Normocephalic. Head is without right periorbital erythema and without left periorbital erythema.  Right Ear: Tympanic membrane, external ear and ear canal normal.  Left Ear: Tympanic membrane, external ear and ear canal normal.  Nose: Nose normal. No mucosal edema or rhinorrhea.  Mouth/Throat: Oropharynx is clear and moist and mucous membranes are normal. No oropharyngeal exudate.  Eyes: Conjunctivae and lids are normal. Pupils are equal, round, and reactive to light.   Neck: Trachea normal. No tracheal deviation present. No thyromegaly present.  Cardiovascular: Normal rate, regular rhythm, S1 normal, S2 normal and normal heart sounds.   No murmur heard. Pulmonary/Chest: Effort normal. No stridor. No tachypnea. No respiratory distress. She has no wheezes. She has no rales. She exhibits no tenderness.  Abdominal: Soft. She exhibits no distension and no mass. There is no hepatosplenomegaly. There is no tenderness. There is no rebound and no guarding.  Musculoskeletal: She exhibits no edema or tenderness.  Lymphadenopathy:       Head (right side): No tonsillar adenopathy present.       Head (left side): No tonsillar adenopathy present.    She has no cervical adenopathy.    She has no axillary adenopathy.  Neurological: She is alert. Gait normal.  Skin: No rash noted. She is not diaphoretic.  No erythema. No pallor. Nails show no clubbing.  Psychiatric: Mood and affect normal.    Diagnostics: Allergy skin tests were performed. She demonstrated hypersensitivity to dog  Spirometry was performed and demonstrated an FEV1 of 2.22 @ 88 % of predicted.  A chest x-ray dated 02/19/2010 at [facility] urgent care identified no significant abnormality.   Assessment and Plan:    1. Other allergic rhinitis   2. LPRD (laryngopharyngeal reflux disease)   3. Cough     1. Allergen avoidance measures  2. Treat and prevent inflammation:   A. continue Flonase one-2 sprays each nostril one time per day  B. continue montelukast 10 mg tablet 1 time per day  C. Depo-Medrol 80 IM delivered in clinic today  3. Treat and prevent reflux:   A. consolidate all caffeine consumption  B. start omeprazole 40 mg in a.m.  C. start ranitidine 300 mg in PM  D. head of bed elevated 4-6 inches  E. no late meals  4. Replace all throat clearing with swallowing maneuver  5. If needed:    A. OTC antihistamine  B. OTC nasal saline   6. Review most recent chest x-ray  report  7. Return to clinic in 3 weeks or earlier if problem  Latecia appears to have a combination of respiratory tract insults including eosinophilic driven inflammation most likely from her dog exposure and reflux-induced respiratory disease which I have made an attempt to address with the therapy noted above. She will utilize anti-inflammatory agents for her respiratory tract and very aggressive therapy directed against reflux as well. I will see her back in this clinic in 3 weeks or earlier to assess her response and consider further evaluation and treatment pending her response.  Jiles Prows, MD [facility]

## 2016-04-05 ENCOUNTER — Ambulatory Visit: Payer: Managed Care, Other (non HMO) | Admitting: Allergy

## 2016-04-25 ENCOUNTER — Encounter: Payer: Self-pay | Admitting: Allergy and Immunology

## 2016-04-25 ENCOUNTER — Ambulatory Visit (INDEPENDENT_AMBULATORY_CARE_PROVIDER_SITE_OTHER): Payer: BC Managed Care – PPO | Admitting: Allergy and Immunology

## 2016-04-25 ENCOUNTER — Ambulatory Visit: Payer: BC Managed Care – PPO | Admitting: Allergy and Immunology

## 2016-04-25 VITALS — BP 130/88 | HR 74 | Resp 18

## 2016-04-25 DIAGNOSIS — K219 Gastro-esophageal reflux disease without esophagitis: Secondary | ICD-10-CM

## 2016-04-25 DIAGNOSIS — R059 Cough, unspecified: Secondary | ICD-10-CM

## 2016-04-25 DIAGNOSIS — J3089 Other allergic rhinitis: Secondary | ICD-10-CM

## 2016-04-25 DIAGNOSIS — R05 Cough: Secondary | ICD-10-CM | POA: Diagnosis not present

## 2016-04-25 NOTE — Progress Notes (Signed)
Follow-up Note  Referring Provider: Heywood Bene, * Primary Provider: Merwyn Katos Date of Office Visit: 04/25/2016  Subjective:   Ashley Eaton (DOB: Jun 22, 1960) is a 56 y.o. female who returns to the [facility] on 04/25/2016 in re-evaluation of the following:  HPI: Ashley Eaton returns to this clinic in reevaluation of her cough. I last saw her in this clinic during her initial evaluation of 04/04/2016 at which point in time we addressed issues with reflux and upper respiratory tract inflammation.  She is much better. She still has some episodes of cough but the frequency of her attacks have diminished dramatically. She can now smell. She has been having some problems with epistaxis. She still has some intermittent ringing and popping in her ear.  Ashley Eaton's informs me that she still continues to drink in energy drinks every morning but has eliminated all her Tea consumption.  She has apparently seen an ENT in [place], possibly Dr. Erik Obey, who evaluated her throat about 2 years ago.  Allergies as of 04/25/2016      Reactions   Amoxicillin-pot Clavulanate Other (See Comments)   Other Other (See Comments)   Synthetic pain medication makes her heart race ?name   Prednisone Other (See Comments)   tablet form, upset stomach, states it makes her crazy      Medication List      fluticasone 50 MCG/ACT nasal spray Commonly known as:  FLONASE 2 sprays by Each Nare route daily.   montelukast 10 MG tablet Commonly known as:  SINGULAIR Take 10 mg by mouth.   multivitamin with minerals Tabs tablet Take 1 tablet by mouth daily.   omeprazole 40 MG capsule Commonly known as:  PRILOSEC Take one capsule every morning before breakfast   ranitidine 300 MG tablet Commonly known as:  ZANTAC Take one tablet every evening   VITAMIN B-12 PO Take by mouth.       Past Medical History:  Diagnosis Date  . Cancer (HCC) 06?   vulva  . Complication  of anesthesia   . Neuromuscular disorder ([place])    Morton's neuroma, plantar fasciitis s/p 10  . PONV (postoperative nausea and vomiting)     Past Surgical History:  Procedure Laterality Date  . CERVICAL CONIZATION W/BX  06  . CHOLECYSTECTOMY N/A 10/15/2013   Procedure: LAPAROSCOPIC CHOLECYSTECTOMY WITH INTRAOPERATIVE CHOLANGIOGRAM;  Surgeon: Donnie Mesa, MD;  Location: [facility];  Service: General;  Laterality: N/A;  . DIAGNOSTIC LAPAROSCOPY     endometriosis- 56 yrs old  . FOOT SURGERY Bilateral 2010,2011   For Plantar Faciitis  . FOOT SURGERY Bilateral 1992,95   For Morton's Neuroma    Review of systems negative except as noted in HPI / PMHx or noted below:  Review of Systems  Constitutional: Negative.   HENT: Negative.   Eyes: Negative.   Respiratory: Negative.   Cardiovascular: Negative.   Gastrointestinal: Negative.   Genitourinary: Negative.   Musculoskeletal: Negative.   Skin: Negative.   Neurological: Negative.   Endo/Heme/Allergies: Negative.   Psychiatric/Behavioral: Negative.      Objective:   Vitals:   04/25/16 1650  BP: 130/88  Pulse: 74  Resp: 18          Physical Exam  Constitutional: She is well-developed, well-nourished, and in no distress.  HENT:  Head: Normocephalic.  Right Ear: Tympanic membrane, external ear and ear canal normal.  Left Ear: Tympanic membrane, external ear and ear canal normal.  Nose: Nose normal. No  mucosal edema or rhinorrhea.  Mouth/Throat: Uvula is midline, oropharynx is clear and moist and mucous membranes are normal. No oropharyngeal exudate.  Eyes: Conjunctivae are normal.  Neck: Trachea normal. No tracheal tenderness present. No tracheal deviation present. No thyromegaly present.  Cardiovascular: Normal rate, regular rhythm, S1 normal, S2 normal and normal heart sounds.   No murmur heard. Pulmonary/Chest: Breath sounds normal. No stridor. No respiratory distress. She has no wheezes. She has no rales.    Musculoskeletal: She exhibits no edema.  Lymphadenopathy:       Head (right side): No tonsillar adenopathy present.       Head (left side): No tonsillar adenopathy present.    She has no cervical adenopathy.  Neurological: She is alert. Gait normal.  Skin: No rash noted. She is not diaphoretic. No erythema. Nails show no clubbing.  Psychiatric: Mood and affect normal.    Diagnostics: None  Assessment and Plan:   1. LPRD (laryngopharyngeal reflux disease)   2. Other allergic rhinitis   3. Cough     1. Continue to perform Allergen avoidance measures  2. Into new Treat and prevent inflammation:   A. Flonase one-2 sprays each nostril 3-7 times per week  B. montelukast 10 mg tablet 1 time per day  3. Continue to Treat and prevent reflux:   A. consolidate all caffeine consumption  B. omeprazole 40 mg in a.m.  C. ranitidine 300 mg in PM  D. head of bed elevated 4-6 inches  E. no late meals  4. Replace all throat clearing with swallowing maneuver  5. If needed:    A. OTC antihistamine  B. OTC nasal saline   6. Return to clinic in June 2018 or earlier if problem  Ashley Eaton is improving with her current therapy directed against reflux and inflammation. The only issue we need to manipulate today is her dose of Flonase as she is developing some epistaxis. I informed her that she should stop using Flonase for 10 days or so and then restarted only 3 days a week. She'll continue to treat reflux aggressively as noted above. I will see her back in this clinic in June 2018 or earlier if there is a problem.  Allena Katz, MD Allergy / Immunology [facility]

## 2016-04-25 NOTE — Patient Instructions (Signed)
  1. Continue to perform Allergen avoidance measures  2. Into new Treat and prevent inflammation:   A. Flonase one-2 sprays each nostril 3-7 times per week  B. montelukast 10 mg tablet 1 time per day  3. Continue to Treat and prevent reflux:   A. consolidate all caffeine consumption  B. omeprazole 40 mg in a.m.  C. ranitidine 300 mg in PM  D. head of bed elevated 4-6 inches  E. no late meals  4. Replace all throat clearing with swallowing maneuver  5. If needed:    A. OTC antihistamine  B. OTC nasal saline   6. Return to clinic in June 2018 or earlier if problem

## 2016-05-26 ENCOUNTER — Other Ambulatory Visit: Payer: Self-pay | Admitting: Allergy and Immunology

## 2016-05-26 MED ORDER — MONTELUKAST SODIUM 10 MG PO TABS: 10.0000 mg | ORAL_TABLET | Freq: Every day | ORAL | 5 refills | Status: DC

## 2016-05-26 NOTE — Telephone Encounter (Signed)
Ashley Eaton called in and stated [facility] never received her SINGUL[facility]IR prescription and she would like [facility] [place].

## 2016-05-26 NOTE — Telephone Encounter (Signed)
prescription has been sent in.

## 2016-06-20 ENCOUNTER — Encounter: Payer: Self-pay | Admitting: Obstetrics & Gynecology

## 2016-06-20 ENCOUNTER — Ambulatory Visit (INDEPENDENT_AMBULATORY_CARE_PROVIDER_SITE_OTHER): Payer: BC Managed Care – PPO | Admitting: Obstetrics & Gynecology

## 2016-06-20 VITALS — BP 118/74 | Ht 62.0 in | Wt 184.0 lb

## 2016-06-20 DIAGNOSIS — Z780 Asymptomatic menopausal state: Secondary | ICD-10-CM

## 2016-06-20 DIAGNOSIS — Z01419 Encounter for gynecological examination (general) (routine) without abnormal findings: Secondary | ICD-10-CM | POA: Diagnosis not present

## 2016-06-20 DIAGNOSIS — Z1151 Encounter for screening for human papillomavirus (HPV): Secondary | ICD-10-CM

## 2016-06-20 DIAGNOSIS — Z30432 Encounter for removal of intrauterine contraceptive device: Secondary | ICD-10-CM | POA: Diagnosis not present

## 2016-06-20 DIAGNOSIS — E669 Obesity, unspecified: Secondary | ICD-10-CM | POA: Diagnosis not present

## 2016-06-20 NOTE — Addendum Note (Signed)
Addended by: Thurnell Garbe A on: 06/20/2016 09:53 AM   Modules accepted: Orders

## 2016-06-20 NOTE — Progress Notes (Signed)
Ashley Eaton [birth year] [id number]   History:    56 y.o. [id number] married  RP:  Established patient presenting for annual gyn exam   HPI:  Well on Mirena IUD, but menopausal x 2015 with FSH at 76.6 then.  Hot flashes x many years, tolerable. No vaginal bleeding, no pelvic pain, normal vaginal secretions.  Breasts wnl.  Mictions/BMs wnl.  Past medical history,surgical history, family history and social history were all reviewed and documented in the EPIC chart.  Gynecologic History No LMP recorded. Patient is not currently having periods (Reason: IUD). Contraception: IUD and post menopausal status Last Pap: 11/2014. Results were: normal Last mammogram: 11/2014. Results were: normal  Obstetric History OB History  Gravida Para Term Preterm AB Living  6 3     2 3   SAB TAB Ectopic Multiple Live Births  1            # Outcome Date GA Lbr Len/2nd Weight Sex Delivery Anes PTL Lv  6 Gravida           5 AB           4 SAB           3 Para           2 Para           1 Para                ROS: A ROS was performed and pertinent positives and negatives are included in the history.  GENERAL: No fevers or chills. HEENT: No change in vision, no earache, sore throat or sinus congestion. NECK: No pain or stiffness. CARDIOVASCULAR: No chest pain or pressure. No palpitations. PULMONARY: No shortness of breath, cough or wheeze. GASTROINTESTINAL: No abdominal pain, nausea, vomiting or diarrhea, melena or bright red blood per rectum. GENITOURINARY: No urinary frequency, urgency, hesitancy or dysuria. MUSCULOSKELETAL: No joint or muscle pain, no back pain, no recent trauma. DERMATOLOGIC: No rash, no itching, no lesions. ENDOCRINE: No polyuria, polydipsia, no heat or cold intolerance. No recent change in weight. HEMATOLOGICAL: No anemia or easy bruising or bleeding. NEUROLOGIC: No headache, seizures, numbness, tingling or weakness. PSYCHIATRIC: No depression, no loss of interest in normal activity  or change in sleep pattern.     Exam:   BP 118/74   Ht 5\' 2"  (1.575 m)   Wt 184 lb (83.5 kg)   BMI 33.65 kg/m   Body mass index is 33.65 kg/m.  General appearance : Well developed well nourished female. No acute distress HEENT: Eyes: no retinal hemorrhage or exudates,  Neck supple, trachea midline, no carotid bruits, no thyroidmegaly Lungs: Clear to auscultation, no rhonchi or wheezes, or rib retractions  Heart: Regular rate and rhythm, no murmurs or gallops Breast:Examined in sitting and supine position were symmetrical in appearance, no palpable masses or tenderness,  no skin retraction, no nipple inversion, no nipple discharge, no skin discoloration, no axillary or supraclavicular lymphadenopathy Abdomen: no palpable masses or tenderness, no rebound or guarding Extremities: no edema or skin discoloration or tenderness  Pelvic:  Bartholin, Urethra, Skene Glands: Within normal limits             Vagina: No gross lesions or discharge  Cervix: No gross lesions or discharge.  Pap/HPV done.  Mirena IUD easily removed by pulling on strings.  Uterus  AV, normal size, shape and consistency, non-tender and mobile  Adnexa  Without masses or tenderness  Anus and perineum  normal  Assessment/Plan:  56 y.o. female for annual exam   1. Encounter for routine gynecological examination with Papanicolaou smear of cervix Normal gyn exam.  Pap/HPV done.  Breast exam normal.  Will schedule screening [facility].  Colono neg 2016.  2. Menopause present Documented with high [place] 2015.  Hot flashes tolerable, no HRT.  3. Encounter for IUD removal  Removed easily by pulling on strings.  4. Obesity BMI 30-34 Low carb diet/Physical activity  Counseling >50% x 10 minutes on above issues  Princess Bruins MD, 9:17 AM 06/20/2016

## 2016-06-20 NOTE — Patient Instructions (Signed)
1. Encounter for routine gynecological examination with Papanicolaou smear of cervix Normal gyn exam.  Pap/HPV done.  Breast exam normal.  Will schedule screening mammo at [facility].  Colono neg 2016.  2. Menopause present Documented with high Joaquin 2015.  Hot flashes tolerable, no HRT.  3. Encounter for IUD removal  Removed easily by pulling on strings.  4. Obesity BMI 30-34 Low carb diet/Physical activity  Claiborne Billings, it was a pleasure to see you today!  I will inform you of your results as soon as possible.  Health Maintenance, Female Adopting a healthy lifestyle and getting preventive care can go a long way to promote health and wellness. Talk with your health care provider about what schedule of regular examinations is right for you. This is a good chance for you to check in with your provider about disease prevention and staying healthy. In between checkups, there are plenty of things you can do on your own. Experts have done a lot of research about which lifestyle changes and preventive measures are most likely to keep you healthy. Ask your health care provider for more information. Weight and diet Eat a healthy diet  Be sure to include plenty of vegetables, fruits, low-fat dairy products, and lean protein.  Do not eat a lot of foods high in solid fats, added sugars, or salt.  Get regular exercise. This is one of the most important things you can do for your health. ? Most adults should exercise for at least 150 minutes each week. The exercise should increase your heart rate and make you sweat (moderate-intensity exercise). ? Most adults should also do strengthening exercises at least twice a week. This is in addition to the moderate-intensity exercise.  Maintain a healthy weight  Body mass index (BMI) is a measurement that can be used to identify possible weight problems. It estimates body fat based on height and weight. Your health care provider can help determine your BMI and help you  achieve or maintain a healthy weight.  For females 38 years of age and older: ? A BMI below 18.5 is considered underweight. ? A BMI of 18.5 to 24.9 is normal. ? A BMI of 25 to 29.9 is considered overweight. ? A BMI of 30 and above is considered obese.  Watch levels of cholesterol and blood lipids  You should start having your blood tested for lipids and cholesterol at 54 years of age, then have this test every 5 years.  You may need to have your cholesterol levels checked more often if: ? Your lipid or cholesterol levels are high. ? You are older than 54 years of age. ? You are at high risk for heart disease.  Cancer screening Lung Cancer  Lung cancer screening is recommended for adults 43-81 years old who are at high risk for lung cancer because of a history of smoking.  A yearly low-dose CT scan of the lungs is recommended for people who: ? Currently smoke. ? Have quit within the past 15 years. ? Have at least a 30-pack-year history of smoking. A pack year is smoking an average of one pack of cigarettes a day for 1 year.  Yearly screening should continue until it has been 15 years since you quit.  Yearly screening should stop if you develop a health problem that would prevent you from having lung cancer treatment.  Breast Cancer  Practice breast self-awareness. This means understanding how your breasts normally appear and feel.  It also means doing regular breast self-exams.  Let your health care provider know about any changes, no matter how small.  If you are in your 20s or 30s, you should have a clinical breast exam (CBE) by a health care provider every 1-3 years as part of a regular health exam.  If you are 101 or older, have a CBE every year. Also consider having a breast X-ray (mammogram) every year.  If you have a family history of breast cancer, talk to your health care provider about genetic screening.  If you are at high risk for breast cancer, talk to your health  care provider about having an MRI and a mammogram every year.  Breast cancer gene (BRCA) assessment is recommended for women who have family members with BRCA-related cancers. BRCA-related cancers include: ? Breast. ? Ovarian. ? Tubal. ? Peritoneal cancers.  Results of the assessment will determine the need for genetic counseling and BRCA1 and BRCA2 testing.  Cervical Cancer Your health care provider may recommend that you be screened regularly for cancer of the pelvic organs (ovaries, uterus, and vagina). This screening involves a pelvic examination, including checking for microscopic changes to the surface of your cervix (Pap test). You may be encouraged to have this screening done every 3 years, beginning at age 14.  For women ages 27-65, health care providers may recommend pelvic exams and Pap testing every 3 years, or they may recommend the Pap and pelvic exam, combined with testing for human papilloma virus (HPV), every 5 years. Some types of HPV increase your risk of cervical cancer. Testing for HPV may also be done on women of any age with unclear Pap test results.  Other health care providers may not recommend any screening for nonpregnant women who are considered low risk for pelvic cancer and who do not have symptoms. Ask your health care provider if a screening pelvic exam is right for you.  If you have had past treatment for cervical cancer or a condition that could lead to cancer, you need Pap tests and screening for cancer for at least 20 years after your treatment. If Pap tests have been discontinued, your risk factors (such as having a new sexual partner) need to be reassessed to determine if screening should resume. Some women have medical problems that increase the chance of getting cervical cancer. In these cases, your health care provider may recommend more frequent screening and Pap tests.  Colorectal Cancer  This type of cancer can be detected and often  prevented.  Routine colorectal cancer screening usually begins at 54 years of age and continues through 54 years of age.  Your health care provider may recommend screening at an earlier age if you have risk factors for colon cancer.  Your health care provider may also recommend using home test kits to check for hidden blood in the stool.  A small camera at the end of a tube can be used to examine your colon directly (sigmoidoscopy or colonoscopy). This is done to check for the earliest forms of colorectal cancer.  Routine screening usually begins at age 64.  Direct examination of the colon should be repeated every 5-10 years through 54 years of age. However, you may need to be screened more often if early forms of precancerous polyps or small growths are found.  Skin Cancer  Check your skin from head to toe regularly.  Tell your health care provider about any new moles or changes in moles, especially if there is a change in a mole's shape or color.  Also tell your health care provider if you have a mole that is larger than the size of a pencil eraser.  Always use sunscreen. Apply sunscreen liberally and repeatedly throughout the day.  Protect yourself by wearing long sleeves, pants, a wide-brimmed hat, and sunglasses whenever you are outside.  Heart disease, diabetes, and high blood pressure  High blood pressure causes heart disease and increases the risk of stroke. High blood pressure is more likely to develop in: ? People who have blood pressure in the high end of the normal range (130-139/85-89 mm Hg). ? People who are overweight or obese. ? People who are African American.  If you are 47-81 years of age, have your blood pressure checked every 3-5 years. If you are 28 years of age or older, have your blood pressure checked every year. You should have your blood pressure measured twice-once when you are at a hospital or clinic, and once when you are not at a hospital or clinic.  Record the average of the two measurements. To check your blood pressure when you are not at a hospital or clinic, you can use: ? An automated blood pressure machine at a pharmacy. ? A home blood pressure monitor.  If you are between 84 years and 33 years old, ask your health care provider if you should take aspirin to prevent strokes.  Have regular diabetes screenings. This involves taking a blood sample to check your fasting blood sugar level. ? If you are at a normal weight and have a low risk for diabetes, have this test once every three years after 54 years of age. ? If you are overweight and have a high risk for diabetes, consider being tested at a younger age or more often. Preventing infection Hepatitis B  If you have a higher risk for hepatitis B, you should be screened for this virus. You are considered at high risk for hepatitis B if: ? You were born in a country where hepatitis B is common. Ask your health care provider which countries are considered high risk. ? Your parents were born in a high-risk country, and you have not been immunized against hepatitis B (hepatitis B vaccine). ? You have HIV or AIDS. ? You use needles to inject street drugs. ? You live with someone who has hepatitis B. ? You have had sex with someone who has hepatitis B. ? You get hemodialysis treatment. ? You take certain medicines for conditions, including cancer, organ transplantation, and autoimmune conditions.  Hepatitis C  Blood testing is recommended for: ? Everyone born from [birth year] through 1963. ? Anyone with known risk factors for hepatitis C.  Sexually transmitted infections (STIs)  You should be screened for sexually transmitted infections (STIs) including gonorrhea and chlamydia if: ? You are sexually active and are younger than 54 years of age. ? You are older than 54 years of age and your health care provider tells you that you are at risk for this type of infection. ? Your sexual  activity has changed since you were last screened and you are at an increased risk for chlamydia or gonorrhea. Ask your health care provider if you are at risk.  If you do not have HIV, but are at risk, it may be recommended that you take a prescription medicine daily to prevent HIV infection. This is called pre-exposure prophylaxis (PrEP). You are considered at risk if: ? You are sexually active and do not regularly use condoms or know the HIV status of  your partner(s). ? You take drugs by injection. ? You are sexually active with a partner who has HIV.  Talk with your health care provider about whether you are at high risk of being infected with HIV. If you choose to begin PrEP, you should first be tested for HIV. You should then be tested every 3 months for as long as you are taking PrEP. Pregnancy  If you are premenopausal and you may become pregnant, ask your health care provider about preconception counseling.  If you may become pregnant, take 400 to 800 micrograms (mcg) of folic acid every day.  If you want to prevent pregnancy, talk to your health care provider about birth control (contraception). Osteoporosis and menopause  Osteoporosis is a disease in which the bones lose minerals and strength with aging. This can result in serious bone fractures. Your risk for osteoporosis can be identified using a bone density scan.  If you are 33 years of age or older, or if you are at risk for osteoporosis and fractures, ask your health care provider if you should be screened.  Ask your health care provider whether you should take a calcium or vitamin D supplement to lower your risk for osteoporosis.  Menopause may have certain physical symptoms and risks.  Hormone replacement therapy may reduce some of these symptoms and risks. Talk to your health care provider about whether hormone replacement therapy is right for you. Follow these instructions at home:  Schedule regular health, dental,  and eye exams.  Stay current with your immunizations.  Do not use any tobacco products including cigarettes, chewing tobacco, or electronic cigarettes.  If you are pregnant, do not drink alcohol.  If you are breastfeeding, limit how much and how often you drink alcohol.  Limit alcohol intake to no more than 1 drink per day for nonpregnant women. One drink equals 12 ounces of beer, 5 ounces of wine, or 1 ounces of hard liquor.  Do not use street drugs.  Do not share needles.  Ask your health care provider for help if you need support or information about quitting drugs.  Tell your health care provider if you often feel depressed.  Tell your health care provider if you have ever been abused or do not feel safe at home. This information is not intended to replace advice given to you by your health care provider. Make sure you discuss any questions you have with your health care provider. Document Released: 07/05/2010 Document Revised: 05/28/2015 Document Reviewed: 09/23/2014 Elsevier Interactive Patient Education  [employer].

## 2016-06-23 LAB — PAP, TP IMAGING W/ HPV RNA, RFLX HPV TYPE 16,18/45: HPV mRNA, High Risk: NOT DETECTED

## 2016-07-18 ENCOUNTER — Encounter: Payer: Self-pay | Admitting: Allergy and Immunology

## 2016-07-18 ENCOUNTER — Ambulatory Visit (INDEPENDENT_AMBULATORY_CARE_PROVIDER_SITE_OTHER): Payer: BC Managed Care – PPO | Admitting: Allergy and Immunology

## 2016-07-18 VITALS — BP 110/86 | HR 76 | Resp 20

## 2016-07-18 DIAGNOSIS — K219 Gastro-esophageal reflux disease without esophagitis: Secondary | ICD-10-CM

## 2016-07-18 DIAGNOSIS — J3089 Other allergic rhinitis: Secondary | ICD-10-CM

## 2016-07-18 NOTE — Patient Instructions (Addendum)
  1. Continue to perform Allergen avoidance measures  2. Continue toTreat and prevent inflammation:   A. Flonase one-2 sprays each nostril 3-7 times per week  B. montelukast 10 mg tablet 1 time per day  3. Continue to Treat and prevent reflux:   A. consolidate all caffeine consumption  B. omeprazole 40 mg in a.m.  C. ranitidine 300 mg in PM  D. head of bed elevated 4-6 inches  E. no late meals  4. Continue to Replace all throat clearing with swallowing maneuver  5. If needed:    A. OTC antihistamine  B. OTC nasal saline   6. Return to clinic in December 2018 or earlier if problem  7. Obtain fall flu vaccine

## 2016-07-18 NOTE — Progress Notes (Signed)
Follow-up Note  Referring Provider: Heywood Bene, * Primary Provider: Merwyn Katos Date of Office Visit: 07/18/2016  Subjective:   Ashley Eaton (DOB: 1960/12/14) is a 56 y.o. female who returns to the [facility] on 07/18/2016 in re-evaluation of the following:  HPI: Resa returns to this clinic in reevaluation of her cough which appeared to be a combination of LPR and allergic rhinitis. I last saw her in this clinic 04/25/2016.  She has done wonderful. She has had very little issues with cough and throat clearing and issues with her nose. It is only over the course of the past week since her sister from [place] has arrived in town and she has been eating out at restaurants very late at night that she has developed an issue with reflux. She has actually had reflux events up into her throat and this has caused her to have some throat clearing and little bit of cough.  She continues to use a new energy drink every morning. She continues on anti-inflammatory medications for her respiratory tract and therapy directed against reflux.  Allergies as of 07/18/2016      Reactions   Amoxicillin-pot Clavulanate Other (See Comments)   Other Other (See Comments)   Synthetic pain medication makes her heart race ?name   Prednisone Other (See Comments)   tablet form, upset stomach, states it makes her crazy      Medication List      fluticasone 50 MCG/ACT nasal spray Commonly known as:  FLONASE 2 sprays by Each Nare route daily.   montelukast 10 MG tablet Commonly known as:  SINGULAIR Take 1 tablet (10 mg total) by mouth at bedtime.   multivitamin with minerals Tabs tablet Take 1 tablet by mouth daily.   omeprazole 40 MG capsule Commonly known as:  PRILOSEC Take one capsule every morning before breakfast   ranitidine 300 MG tablet Commonly known as:  ZANTAC Take one tablet every evening   VITAMIN B-12 PO Take by mouth.       Past  Medical History:  Diagnosis Date  . Cancer (HCC) 06?   vulva  . Complication of anesthesia   . LPRD (laryngopharyngeal reflux disease)   . Neuromuscular disorder ([place])    Morton's neuroma, plantar fasciitis s/p 10  . PONV (postoperative nausea and vomiting)     Past Surgical History:  Procedure Laterality Date  . CERVICAL CONIZATION W/BX  06  . CHOLECYSTECTOMY N/A 10/15/2013   Procedure: LAPAROSCOPIC CHOLECYSTECTOMY WITH INTRAOPERATIVE CHOLANGIOGRAM;  Surgeon: Donnie Mesa, MD;  Location: [facility];  Service: General;  Laterality: N/A;  . COLONOSCOPY  12/2014   with Dr. Collene Mares next one in 5 yrs  . DIAGNOSTIC LAPAROSCOPY     endometriosis- 56 yrs old  . FOOT SURGERY Bilateral 2010,2011   For Plantar Faciitis  . FOOT SURGERY Bilateral 1992,95   For Morton's Neuroma    Review of systems negative except as noted in HPI / PMHx or noted below:  Review of Systems  Constitutional: Negative.   HENT: Negative.   Eyes: Negative.   Respiratory: Negative.   Cardiovascular: Negative.   Gastrointestinal: Negative.   Genitourinary: Negative.   Musculoskeletal: Negative.   Skin: Negative.   Neurological: Negative.   Endo/Heme/Allergies: Negative.   Psychiatric/Behavioral: Negative.      Objective:   Vitals:   07/18/16 0849  BP: 110/86  Pulse: 76  Resp: 20          Physical Exam  Constitutional: She is well-developed, well-nourished, and in no distress.  HENT:  Head: Normocephalic.  Right Ear: Tympanic membrane, external ear and ear canal normal.  Left Ear: Tympanic membrane, external ear and ear canal normal.  Nose: Nose normal. No mucosal edema or rhinorrhea.  Mouth/Throat: Uvula is midline, oropharynx is clear and moist and mucous membranes are normal. No oropharyngeal exudate.  Eyes: Conjunctivae are normal.  Neck: Trachea normal. No tracheal tenderness present. No tracheal deviation present. No thyromegaly present.  Cardiovascular: Normal rate, regular rhythm, S1  normal, S2 normal and normal heart sounds.   No murmur heard. Pulmonary/Chest: Breath sounds normal. No stridor. No respiratory distress. She has no wheezes. She has no rales.  Musculoskeletal: She exhibits no edema.  Lymphadenopathy:       Head (right side): No tonsillar adenopathy present.       Head (left side): No tonsillar adenopathy present.    She has no cervical adenopathy.  Neurological: She is alert. Gait normal.  Skin: No rash noted. She is not diaphoretic. No erythema. Nails show no clubbing.  Psychiatric: Mood and affect normal.    Diagnostics: none  Assessment and Plan:   1. LPRD (laryngopharyngeal reflux disease)   2. Other allergic rhinitis     1. Continue to perform Allergen avoidance measures  2. Continue toTreat and prevent inflammation:   A. Flonase one-2 sprays each nostril 3-7 times per week  B. montelukast 10 mg tablet 1 time per day  3. Continue to Treat and prevent reflux:   A. consolidate all caffeine consumption  B. omeprazole 40 mg in a.m.  C. ranitidine 300 mg in PM  D. head of bed elevated 4-6 inches  E. no late meals  4. Continue to Replace all throat clearing with swallowing maneuver  5. If needed:    A. OTC antihistamine  B. OTC nasal saline   6. Return to clinic in December 2018 or earlier if problem  7. Obtain fall flu vaccine  Matti appears to be doing very well and she has a very good understanding of her condition which includes inflammation of her airway and reflux-induced respiratory disease. At this point we will assume that she will continued to do well throughout the entire fall and winter and I will see her back in this clinic in 6 months or earlier if there is a problem. She will contact me during the interval should there be an issue.  Allena Katz, MD Allergy / Immunology [place]

## 2016-12-28 ENCOUNTER — Encounter: Payer: Self-pay | Admitting: Allergy and Immunology

## 2016-12-28 ENCOUNTER — Ambulatory Visit (INDEPENDENT_AMBULATORY_CARE_PROVIDER_SITE_OTHER): Payer: BC Managed Care – PPO | Admitting: Allergy and Immunology

## 2016-12-28 VITALS — BP 140/92 | HR 88 | Resp 20

## 2016-12-28 DIAGNOSIS — J4530 Mild persistent asthma, uncomplicated: Secondary | ICD-10-CM | POA: Diagnosis not present

## 2016-12-28 DIAGNOSIS — K219 Gastro-esophageal reflux disease without esophagitis: Secondary | ICD-10-CM | POA: Diagnosis not present

## 2016-12-28 DIAGNOSIS — J3089 Other allergic rhinitis: Secondary | ICD-10-CM

## 2016-12-28 MED ORDER — HYDROCOD POLST-CPM POLST ER 10-8 MG/5ML PO SUER: ORAL | 0 refills | Status: DC

## 2016-12-28 MED ORDER — MOMETASONE FUROATE 200 MCG/ACT IN AERO: 2.0000 | INHALATION_SPRAY | Freq: Two times a day (BID) | RESPIRATORY_TRACT | 5 refills | Status: DC

## 2016-12-28 NOTE — Patient Instructions (Addendum)
  1. Continue to perform Allergen avoidance measures  2. Continue toTreat and prevent inflammation:   A. Flonase one-2 sprays each nostril 3-7 times per week  B. montelukast 10 mg tablet 1 time per day  C. Start Asmanex 200 HFA - 2 inhalations two times per day with spacer  3. Continue to Treat and prevent reflux:   A. consolidate all caffeine consumption  B. omeprazole 40 mg in AM  C. ranitidine 300 mg in PM  D. head of bed elevated 4-6 inches and no late meals  4. Continue to Replace all throat clearing with swallowing maneuver  5. If needed:      A. OTC antihistamine  B. OTC nasal saline   C. Proair HFA 2 inhalations every 4-6 hours  6. For this episode:   A.  Tussionex- 2.5 - 5.0 mls every 12 hours for cough. NARCOTIC. 60 ml  7. Consider starting a course of immunotherapy against dog  8. Return to clinic in 12 weeks or earlier if problem

## 2016-12-28 NOTE — Progress Notes (Signed)
Follow-up Note  Referring Provider: Heywood Bene, * Primary Provider: Merwyn Katos Date of Office Visit: 12/28/2016  Subjective:   Ashley Eaton (DOB: 1960-06-06) is a 56 y.o. female who returns to the [facility] on 12/28/2016 in re-evaluation of the following:  HPI: Ashley Eaton returns to this clinic in reevaluation of cough.  She is followed in this clinic for LPR and allergic rhinitis and her last visit was 18 July 2016.  She did very well up until October at which point in time she had to go to the urgent care center for "coughing attack" associated with posttussive emesis for which she received antibiotics and steroids.  Once again in November she presented with a similar type of presentation and once again went to the urgent care center and once again was treated with antibiotics and a steroid.  This past Friday she started to develop problems with cough and tickle in her throat and posttussive emesis as well as ear fullness and feeling as though her face is full.  She has not had any fever or ugly nasal discharge or chest pain or ugly sputum production.  She does feel as though there is "sharp glass in her throat".  She tried a short acting bronchodilator and this did not really help very much.  She has been really good about treating her LPR but still continues to consume some caffeine.  She did obtain the flu vaccine this year.  Allergies as of 12/28/2016      Reactions   Amoxicillin-pot Clavulanate Other (See Comments)   Other Other (See Comments)   Synthetic pain medication makes her heart race ?name   Prednisone Other (See Comments)   tablet form, upset stomach, states it makes her crazy      Medication List      fluticasone 50 MCG/ACT nasal spray Commonly known as:  FLONASE 2 sprays by Each Nare route daily.   montelukast 10 MG tablet Commonly known as:  SINGULAIR Take 1 tablet (10 mg total) by mouth at bedtime.     multivitamin with minerals Tabs tablet Take 1 tablet by mouth daily.   omeprazole 40 MG capsule Commonly known as:  PRILOSEC Take one capsule every morning before breakfast   PROAIR HFA 108 (90 Base) MCG/ACT inhaler Generic drug:  albuterol   ranitidine 300 MG tablet Commonly known as:  ZANTAC Take one tablet every evening   VITAMIN B-12 PO Take by mouth.       Past Medical History:  Diagnosis Date  . Cancer (HCC) 06?   vulva  . Complication of anesthesia   . LPRD (laryngopharyngeal reflux disease)   . Neuromuscular disorder ([place])    Morton's neuroma, plantar fasciitis s/p 10  . PONV (postoperative nausea and vomiting)     Past Surgical History:  Procedure Laterality Date  . CERVICAL CONIZATION W/BX  06  . CHOLECYSTECTOMY N/A 10/15/2013   Procedure: LAPAROSCOPIC CHOLECYSTECTOMY WITH INTRAOPERATIVE CHOLANGIOGRAM;  Surgeon: Donnie Mesa, MD;  Location: [facility];  Service: General;  Laterality: N/A;  . COLONOSCOPY  12/2014   with Dr. Collene Mares next one in 5 yrs  . DIAGNOSTIC LAPAROSCOPY     endometriosis- 56 yrs old  . FOOT SURGERY Bilateral 2010,2011   For Plantar Faciitis  . FOOT SURGERY Bilateral 1992,95   For Morton's Neuroma    Review of systems negative except as noted in HPI / PMHx or noted below:  Review of Systems  Constitutional: Negative.  HENT: Negative.   Eyes: Negative.   Respiratory: Negative.   Cardiovascular: Negative.   Gastrointestinal: Negative.   Genitourinary: Negative.   Musculoskeletal: Negative.   Skin: Negative.   Neurological: Negative.   Endo/Heme/Allergies: Negative.   Psychiatric/Behavioral: Negative.      Objective:   Vitals:   12/28/16 1403  BP: (!) 140/92  Pulse: 88  Resp: 20          Physical Exam  Constitutional: She is well-developed, well-nourished, and in no distress.  Slightly nasal voice, coughing  HENT:  Head: Normocephalic.  Right Ear: External ear and ear canal normal. A middle ear effusion is present.   Left Ear: External ear and ear canal normal. A middle ear effusion is present.  Nose: Mucosal edema (Erythematous) present. No rhinorrhea.  Mouth/Throat: Uvula is midline, oropharynx is clear and moist and mucous membranes are normal. No oropharyngeal exudate.  Eyes: Conjunctivae are normal.  Neck: Trachea normal. No tracheal tenderness present. No tracheal deviation present. No thyromegaly present.  Cardiovascular: Normal rate, regular rhythm, S1 normal, S2 normal and normal heart sounds.  No murmur heard. Pulmonary/Chest: Breath sounds normal. No stridor. No respiratory distress. She has no wheezes. She has no rales.  Musculoskeletal: She exhibits no edema.  Lymphadenopathy:       Head (right side): No tonsillar adenopathy present.       Head (left side): No tonsillar adenopathy present.    She has no cervical adenopathy.  Neurological: She is alert. Gait normal.  Skin: No rash noted. She is not diaphoretic. No erythema. Nails show no clubbing.  Psychiatric: Mood and affect normal.    Diagnostics:    Spirometry was performed and demonstrated an FEV1 of 1.77 at 71 % of predicted.  Assessment and Plan:   1. Not well controlled mild persistent asthma   2. Other allergic rhinitis   3. LPRD (laryngopharyngeal reflux disease)     1. Continue to perform Allergen avoidance measures  2. Continue toTreat and prevent inflammation:   A. Flonase one-2 sprays each nostril 3-7 times per week  B. montelukast 10 mg tablet 1 time per day  C. Start Asmanex 200 HFA - 2 inhalations two times per day with spacer  3. Continue to Treat and prevent reflux:   A. consolidate all caffeine consumption  B. omeprazole 40 mg in AM  C. ranitidine 300 mg in PM  D. head of bed elevated 4-6 inches and no late meals  4. Continue to Replace all throat clearing with swallowing maneuver  5. If needed:      A. OTC antihistamine  B. OTC nasal saline   C. Proair HFA - 2 inhalations every 4-6 hours  6.  For this episode:   A.  Tussionex- 2.5 - 5.0 mls every 12 hours for cough. NARCOTIC. 60 ml  7. Consider starting a course of immunotherapy against dog  8. Return to clinic in 12 weeks or earlier if problem   Ashley Eaton did relatively well except for the past 3 months at which point in time she has had 3 exacerbations of her coughing episodes.  Assuming that there is a component of inflammation as well as reflux induced respiratory disease we will start her on an inhaled steroid as noted above.  Her most recent episode appears to be precipitated by a viral respiratory tract infection and we will treat her conservatively for this issue although I am going to prescribe a narcotic-based cough medication with a strong narcotic warning provided during today's  visit.  Given her sensitivity to dog and the fact that there is a dog inside the household it may benefit her to start a course of immunotherapy as some of her low-grade and consistent inflammation is probably driven by exposure to this allergen.  I will see her back in this clinic in 12 weeks or earlier if there is a problem.  Allena Katz, MD Allergy / Immunology [place]

## 2016-12-29 ENCOUNTER — Encounter: Payer: Self-pay | Admitting: Allergy and Immunology

## 2017-01-26 ENCOUNTER — Ambulatory Visit: Payer: BC Managed Care – PPO | Admitting: Allergy and Immunology

## 2017-02-13 ENCOUNTER — Other Ambulatory Visit: Payer: Self-pay | Admitting: Allergy and Immunology

## 2017-03-14 ENCOUNTER — Other Ambulatory Visit: Payer: Self-pay | Admitting: Allergy and Immunology

## 2017-03-23 ENCOUNTER — Ambulatory Visit: Payer: BC Managed Care – PPO | Admitting: Allergy and Immunology

## 2017-03-23 VITALS — BP 130/82 | HR 80 | Resp 18

## 2017-03-23 DIAGNOSIS — J4530 Mild persistent asthma, uncomplicated: Secondary | ICD-10-CM | POA: Diagnosis not present

## 2017-03-23 DIAGNOSIS — K219 Gastro-esophageal reflux disease without esophagitis: Secondary | ICD-10-CM

## 2017-03-23 DIAGNOSIS — J3089 Other allergic rhinitis: Secondary | ICD-10-CM

## 2017-03-23 NOTE — Progress Notes (Signed)
Follow-up Note  Referring Provider: Heywood Bene, * Primary Provider: Merwyn Katos Date of Office Visit: 03/23/2017  Subjective:   Ashley Eaton (DOB: 1960-03-19) is a 57 y.o. female who returns to the [facility] [employer] on 03/23/2017 in re-evaluation of the following:  HPI: Ashley Eaton returns to this clinic in reevaluation of her asthma and allergic rhinoconjunctivitis and LPR.  Her last visit to this clinic was 28 December 2016.  She is over her flareup that was addressed during her last evaluation.  The only time she coughs at this point in time is when she is laughing or she gets into some emotional situation.  She has not been using her Asmanex.  Overall her reflux is under good control if she is careful about what she eats.  She still continues to drink at least 1 caffeinated drink a day.  She continues on a proton pump inhibitor and an H2 receptor blocker.  Her nose is doing very well and she is not been consistently using her Flonase but does consistently use montelukast.  She has elected not to undergo a course of immunotherapy directed against her dog at this point in time.  Allergies as of 03/23/2017      Reactions   Amoxicillin-pot Clavulanate Other (See Comments)   Other Other (See Comments)   Synthetic pain medication makes her heart race ?name   Prednisone Other (See Comments)   tablet form, upset stomach, states it makes her crazy      Medication List      fluticasone 50 MCG/ACT nasal spray Commonly known as:  FLONASE 2 sprays by Each Nare route as needed   Mometasone Furoate 200 MCG/ACT Aero Commonly known as:  ASMANEX HFA Inhale 2 puffs into the lungs 2 (two) times daily. Rinse, gargle, and spit after use. Use with spacer.   montelukast 10 MG tablet Commonly known as:  SINGULAIR TAKE 1 TABLET BY MOUTH EVERYDAY AT BEDTIME   multivitamin with minerals Tabs tablet Take 1 tablet by mouth daily.   omeprazole 40 MG  capsule Commonly known as:  PRILOSEC Take one capsule every morning before breakfast   PROAIR HFA 108 (90 Base) MCG/ACT inhaler Generic drug:  albuterol   ranitidine 300 MG tablet Commonly known as:  ZANTAC Take one tablet every evening   VITAMIN B-12 PO Take by mouth.       Past Medical History:  Diagnosis Date  . Cancer (HCC) 06?   vulva  . Complication of anesthesia   . LPRD (laryngopharyngeal reflux disease)   . Neuromuscular disorder ([place])    Morton's neuroma, plantar fasciitis s/p 10  . PONV (postoperative nausea and vomiting)     Past Surgical History:  Procedure Laterality Date  . CERVICAL CONIZATION W/BX  06  . CHOLECYSTECTOMY N/A 10/15/2013   Procedure: LAPAROSCOPIC CHOLECYSTECTOMY WITH INTRAOPERATIVE CHOLANGIOGRAM;  Surgeon: Donnie Mesa, MD;  Location: [facility];  Service: General;  Laterality: N/A;  . COLONOSCOPY  12/2014   with Dr. Collene Mares next one in 5 yrs  . DIAGNOSTIC LAPAROSCOPY     endometriosis- 57 yrs old  . FOOT SURGERY Bilateral 2010,2011   For Plantar Faciitis  . FOOT SURGERY Bilateral 1992,95   For Morton's Neuroma    Review of systems negative except as noted in HPI / PMHx or noted below:  Review of Systems  Constitutional: Negative.   HENT: Negative.   Eyes: Negative.   Respiratory: Negative.   Cardiovascular: Negative.   Gastrointestinal:  Negative.   Genitourinary: Negative.   Musculoskeletal: Negative.   Skin: Negative.   Neurological: Negative.   Endo/Heme/Allergies: Negative.   Psychiatric/Behavioral: Negative.      Objective:   Vitals:   03/23/17 1642  BP: 130/82  Pulse: 80  Resp: 18          Physical Exam  Constitutional: She is well-developed, well-nourished, and in no distress.  HENT:  Head: Normocephalic.  Right Ear: Tympanic membrane, external ear and ear canal normal.  Left Ear: Tympanic membrane, external ear and ear canal normal.  Nose: Nose normal. No mucosal edema or rhinorrhea.  Mouth/Throat: Uvula is  midline, oropharynx is clear and moist and mucous membranes are normal. No oropharyngeal exudate.  Eyes: Conjunctivae are normal.  Neck: Trachea normal. No tracheal tenderness present. No tracheal deviation present. No thyromegaly present.  Cardiovascular: Normal rate, regular rhythm, S1 normal, S2 normal and normal heart sounds.  No murmur heard. Pulmonary/Chest: Breath sounds normal. No stridor. No respiratory distress. She has no wheezes. She has no rales.  Musculoskeletal: She exhibits no edema.  Lymphadenopathy:       Head (right side): No tonsillar adenopathy present.       Head (left side): No tonsillar adenopathy present.    She has no cervical adenopathy.  Neurological: She is alert. Gait normal.  Skin: No rash noted. She is not diaphoretic. No erythema. Nails show no clubbing.  Psychiatric: Mood and affect normal.    Diagnostics:    Spirometry was performed and demonstrated an FEV1 of 1.86 at 74 % of predicted.  The patient had an Asthma Control Test with the following results: ACT Total Score: 16.    Assessment and Plan:   1. Asthma, well controlled, mild persistent   2. Other allergic rhinitis   3. LPRD (laryngopharyngeal reflux disease)     1. Continue to perform Allergen avoidance measures  2. Continue toTreat and prevent inflammation:   A. Flonase one-2 sprays each nostril 3-7 times per week  B. montelukast 10 mg tablet 1 time per day  C. Asmanex 200 HFA - 2 inhalations two times per day with spacer  3. Continue to Treat and prevent reflux:   A. omeprazole 40 mg in AM  C. ranitidine 300 mg in PM  4. If needed:      A. OTC antihistamine  B. OTC nasal saline   C. Proair HFA 2 inhalations every 4-6 hours   5. Return to clinic in summer 2019 or earlier if problem   Jakirah appears to be doing quite well on her current plan and I think she would benefit from consistently using anti-inflammatory medications for her respiratory tract and pretty aggressive  therapy directed against reflux at this point.  As well, I think she would benefit from a course of immunotherapy directed against her indoor dog as this is no doubt driving some of her condition.  I will see her back in this clinic in the summer 2019 or earlier if there is a problem.  Allena Katz, MD Allergy / Immunology [facility]

## 2017-03-23 NOTE — Patient Instructions (Addendum)
  1. Continue to perform Allergen avoidance measures  2. Continue toTreat and prevent inflammation:   A. Flonase one-2 sprays each nostril 3-7 times per week  B. montelukast 10 mg tablet 1 time per day  C. Asmanex 200 HFA - 2 inhalations two times per day with spacer  3. Continue to Treat and prevent reflux:   A. omeprazole 40 mg in AM  C. ranitidine 300 mg in PM  4. If needed:      A. OTC antihistamine  B. OTC nasal saline   C. Proair HFA 2 inhalations every 4-6 hours   5. Return to clinic in summer 2019 or earlier if problem

## 2017-03-27 ENCOUNTER — Encounter: Payer: Self-pay | Admitting: Allergy and Immunology

## 2017-04-04 ENCOUNTER — Other Ambulatory Visit: Payer: Self-pay | Admitting: Allergy and Immunology

## 2017-04-17 ENCOUNTER — Other Ambulatory Visit: Payer: Self-pay | Admitting: Allergy and Immunology

## 2017-04-18 ENCOUNTER — Other Ambulatory Visit: Payer: Self-pay | Admitting: Allergy and Immunology

## 2017-05-10 ENCOUNTER — Ambulatory Visit: Payer: BC Managed Care – PPO | Admitting: Allergy and Immunology

## 2017-05-10 ENCOUNTER — Encounter: Payer: Self-pay | Admitting: Allergy and Immunology

## 2017-05-10 VITALS — BP 110/82 | HR 80 | Temp 98.5°F | Resp 18

## 2017-05-10 DIAGNOSIS — J4541 Moderate persistent asthma with (acute) exacerbation: Secondary | ICD-10-CM

## 2017-05-10 DIAGNOSIS — J3089 Other allergic rhinitis: Secondary | ICD-10-CM | POA: Diagnosis not present

## 2017-05-10 DIAGNOSIS — K219 Gastro-esophageal reflux disease without esophagitis: Secondary | ICD-10-CM

## 2017-05-10 MED ORDER — OMEPRAZOLE 40 MG PO CPDR: DELAYED_RELEASE_CAPSULE | ORAL | 5 refills | Status: DC

## 2017-05-10 MED ORDER — METHYLPREDNISOLONE ACETATE 80 MG/ML IJ SUSP
80.0000 mg | Freq: Once | INTRAMUSCULAR | Status: AC
  Administered 2017-05-10: 80 mg via INTRAMUSCULAR

## 2017-05-10 MED ORDER — BUDESONIDE-FORMOTEROL FUMARATE 160-4.5 MCG/ACT IN AERO: INHALATION_SPRAY | RESPIRATORY_TRACT | 5 refills | Status: DC

## 2017-05-10 MED ORDER — ALBUTEROL SULFATE HFA 108 (90 BASE) MCG/ACT IN AERS: INHALATION_SPRAY | RESPIRATORY_TRACT | 1 refills | Status: DC

## 2017-05-10 NOTE — Progress Notes (Signed)
Follow-up Note  Referring Provider: Heywood Bene, * Primary Provider: Merwyn Katos Date of Office Visit: 05/10/2017  Subjective:   Ashley Eaton (DOB: 1960-08-31) is a 57 y.o. female who returns to the [facility] on 05/10/2017 in re-evaluation of the following:  HPI: Ashley Eaton presents to this clinic in evaluation of respiratory tract symptoms that have been present for approximately 3 weeks.  She is followed in this clinic for asthma, allergic rhinoconjunctivitis and LPR which has been under very good control since her last visit of 23 March 2017.  About 3 weeks ago she developed a sore throat and coughing and wheezing and this has been progressive since that point in time.  She has ear fullness and she is stuffy but does not have any anosmia or ugly nasal discharge or headaches.  She has been having posttussive emesis and she is nauseated.  She has had no fever or chills or chest pain but she has developed some back pain with her coughing at the very lower part of her chest and she is not sleeping well because of all her coughing.  She was around individuals who are sick with a respiratory tract infection at the beginning of this event.  Allergies as of 05/10/2017      Reactions   Amoxicillin-pot Clavulanate Other (See Comments)   Other Other (See Comments)   Synthetic pain medication makes her heart race ?name   Prednisone Other (See Comments)   tablet form, upset stomach, states it makes her crazy      Medication List      fluticasone 50 MCG/ACT nasal spray Commonly known as:  FLONASE 2 sprays by Each Nare route as needed   Mometasone Furoate 200 MCG/ACT Aero Commonly known as:  ASMANEX HFA Inhale 2 puffs into the lungs 2 (two) times daily. Rinse, gargle, and spit after use. Use with spacer.   montelukast 10 MG tablet Commonly known as:  SINGULAIR TAKE 1 TABLET BY MOUTH EVERYDAY AT BEDTIME   multivitamin with minerals Tabs  tablet Take 1 tablet by mouth daily.   omeprazole 40 MG capsule Commonly known as:  PRILOSEC TAKE ONE CAPSULE EVERY MORNING BEFORE BREAKFAST   PROAIR HFA 108 (90 Base) MCG/ACT inhaler Generic drug:  albuterol   ranitidine 300 MG tablet Commonly known as:  ZANTAC TAKE 1 TABLET BY MOUTH EVERY EVENING   VITAMIN B-12 PO Take by mouth.       Past Medical History:  Diagnosis Date  . Cancer (HCC) 06?   vulva  . Complication of anesthesia   . LPRD (laryngopharyngeal reflux disease)   . Neuromuscular disorder ([place])    Morton's neuroma, plantar fasciitis s/p 10  . PONV (postoperative nausea and vomiting)     Past Surgical History:  Procedure Laterality Date  . CERVICAL CONIZATION W/BX  06  . CHOLECYSTECTOMY N/A 10/15/2013   Procedure: LAPAROSCOPIC CHOLECYSTECTOMY WITH INTRAOPERATIVE CHOLANGIOGRAM;  Surgeon: Donnie Mesa, MD;  Location: [facility];  Service: General;  Laterality: N/A;  . COLONOSCOPY  12/2014   with Dr. Collene Mares next one in 5 yrs  . DIAGNOSTIC LAPAROSCOPY     endometriosis- 57 yrs old  . FOOT SURGERY Bilateral 2010,2011   For Plantar Faciitis  . FOOT SURGERY Bilateral 1992,95   For Morton's Neuroma    Review of systems negative except as noted in HPI / PMHx or noted below:  Review of Systems  Constitutional: Negative.   HENT: Negative.   Eyes: Negative.  Respiratory: Negative.   Cardiovascular: Negative.   Gastrointestinal: Negative.   Genitourinary: Negative.   Musculoskeletal: Negative.   Skin: Negative.   Neurological: Negative.   Endo/Heme/Allergies: Negative.   Psychiatric/Behavioral: Negative.      Objective:   Vitals:   05/10/17 1447  BP: 110/82  Pulse: 80  Resp: 18  Temp: 98.5 F (36.9 C)          Physical Exam  Constitutional:  Coughing  HENT:  Head: Normocephalic.  Right Ear: External ear and ear canal normal. A middle ear effusion (Dull light reflex) is present.  Left Ear: External ear and ear canal normal. A middle ear  effusion (Dull light reflex) is present.  Nose: Mucosal edema present. No rhinorrhea.  Mouth/Throat: Uvula is midline, oropharynx is clear and moist and mucous membranes are normal. No oropharyngeal exudate.  Eyes: Conjunctivae are normal.  Neck: Trachea normal. No tracheal tenderness present. No tracheal deviation present. No thyromegaly present.  Cardiovascular: Normal rate, regular rhythm, S1 normal, S2 normal and normal heart sounds.  No murmur heard. Pulmonary/Chest: No stridor. No respiratory distress. She has wheezes (Bilateral inspiratory and expiratory wheezes all lung fields). She has no rales.  Musculoskeletal: She exhibits no edema.  Lymphadenopathy:       Head (right side): No tonsillar adenopathy present.       Head (left side): No tonsillar adenopathy present.    She has no cervical adenopathy.  Neurological: She is alert.  Skin: No rash noted. She is not diaphoretic. No erythema. Nails show no clubbing.    Diagnostics:    Spirometry was performed and demonstrated an FEV1 of 0.92 at 37 % of predicted.  She had a difficult time performing this maneuver because of coughing.  The patient had an Asthma Control Test with the following results: ACT Total Score: 12.    Assessment and Plan:   1. Asthma, not well controlled, moderate persistent, with acute exacerbation   2. Other allergic rhinitis   3. LPRD (laryngopharyngeal reflux disease)     1. Continue to perform Allergen avoidance measures  2. Continue toTreat and prevent inflammation:   A. Flonase one-2 sprays each nostril 3-7 times per week  B. montelukast 10 mg tablet 1 time per day  C. Symbicort 160 - 2 inhalations 2 times per day with spacer (no Asmanex)   3. Continue to Treat and prevent reflux:   A.  Increase omeprazole 40 mg 2 times a day  C. ranitidine 300 mg in PM  4. If needed:      A. OTC antihistamine  B. OTC nasal saline   C. Proair HFA 2 inhalations every 4-6 hours   5. For this recent  episode:   A. Depo-Medrol 80 IM delivered in clinic today  B. Prednisone 20 mg 1 time per day for 5 days.  First dose now  C. DuoNeb delivered in clinic today  6. Return to clinic in summer 2019 or earlier if problem   Ashley Eaton appears to have rather significant inflammation of her respiratory tract and I suspect that this is secondary to a viral respiratory tract infection and we will treat her with systemic steroids as well as a large collection of other anti-inflammatory agents for her respiratory tract and see how things goes over the next 8 to 12 weeks.  She will contact me during the interval should there be a significant problem.  Allena Katz, MD Allergy / Immunology [facility]

## 2017-05-10 NOTE — Patient Instructions (Addendum)
  1. Continue to perform Allergen avoidance measures  2. Continue toTreat and prevent inflammation:   A. Flonase one-2 sprays each nostril 3-7 times per week  B. montelukast 10 mg tablet 1 time per day  C. Symbicort 160 - 2 inhalations 2 times per day with spacer (no Asmanex)   3. Continue to Treat and prevent reflux:   A.  Increase omeprazole 40 mg 2 times a day  C. ranitidine 300 mg in PM  4. If needed:      A. OTC antihistamine  B. OTC nasal saline   C. Proair HFA 2 inhalations every 4-6 hours   5. For this recent episode:   A. Depo-Medrol 80 IM delivered in clinic today  B. Prednisone 20 mg 1 time per day for 5 days.  First dose now  C. DuoNeb delivered in clinic today  6. Return to clinic in summer 2019 or earlier if problem

## 2017-05-11 ENCOUNTER — Encounter: Payer: Self-pay | Admitting: Allergy and Immunology

## 2017-06-15 ENCOUNTER — Encounter: Payer: Self-pay | Admitting: Allergy and Immunology

## 2017-06-15 ENCOUNTER — Ambulatory Visit: Payer: BC Managed Care – PPO | Admitting: Allergy and Immunology

## 2017-06-15 VITALS — BP 116/76 | HR 64 | Resp 16

## 2017-06-15 DIAGNOSIS — J4540 Moderate persistent asthma, uncomplicated: Secondary | ICD-10-CM | POA: Diagnosis not present

## 2017-06-15 DIAGNOSIS — K219 Gastro-esophageal reflux disease without esophagitis: Secondary | ICD-10-CM | POA: Diagnosis not present

## 2017-06-15 DIAGNOSIS — J3089 Other allergic rhinitis: Secondary | ICD-10-CM | POA: Diagnosis not present

## 2017-06-15 DIAGNOSIS — R0789 Other chest pain: Secondary | ICD-10-CM

## 2017-06-15 NOTE — Patient Instructions (Addendum)
  1. Continue to perform Allergen avoidance measures  2. Continue toTreat and prevent inflammation:   A. Flonase one-2 sprays each nostril 3-7 times per week  B. montelukast 10 mg tablet 1 time per day  C. Symbicort 160 - 2 inhalations 2 times per day with spacer    3. Continue to Treat and prevent reflux:   A.  omeprazole 40 mg 2 times a day  C. ranitidine 300 mg in PM  4. If needed:      A. OTC antihistamine  B. OTC nasal saline   C. Proair HFA 2 inhalations every 4-6 hours   5. Return to clinic in 12 weeks or earlier if problem  6. Contact clinic if still with right chest pain for one additional week

## 2017-06-15 NOTE — Progress Notes (Signed)
Follow-up Note  Referring Provider: Heywood Bene, * Primary Provider: Merwyn Katos Date of Office Visit: 06/15/2017  Subjective:   Ashley Eaton (DOB: [birth year]) is a 57 y.o. female who returns to the [facility] on 06/15/2017 in re-evaluation of the following:  HPI: Emile returns to this clinic in reevaluation of her asthma and allergic rhinitis and LPR.  I last saw her in this clinic on 10 May 2017 at which point in time she appeared to have a rather significant flare of her respiratory tract disease most likely secondary to a viral respiratory tract infection.  With therapy administered during her last visit she did completely resolve all of her respiratory tract symptoms and at this point time she does not have any coughing and has no need to use a short acting bronchodilator and has no issues with her nose.  Her reflux is okay with omeprazole and ranitidine as prescribed but she still has occasional regurgitation events.  For the past 2 weeks she has developed chest wall pain on the right lateral aspect in the right posterior aspect of her right thorax that is positional in nature and rotational in nature.  This also appears to be tender.  It is a waxing and waning intensity and for a few days she did very well but for a few days she did not do very well.  There does not appear to be any precipitant giving rise to this issue.  Allergies as of 06/15/2017      Reactions   Amoxicillin-pot Clavulanate Other (See Comments)   Other Other (See Comments)   Synthetic pain medication makes her heart race ?name   Prednisone Other (See Comments)   tablet form, upset stomach, states it makes her crazy      Medication List      albuterol 108 (90 Base) MCG/ACT inhaler Commonly known as:  PROAIR HFA Inhale two puffs every 4-6 hours if needed for cough, wheeze, shortness of breath.   budesonide-formoterol 160-4.5 MCG/ACT inhaler Commonly known as:   SYMBICORT Inhale two puffs twice daily to prevent cough or wheeze. Rinse mouth after use.   fluticasone 50 MCG/ACT nasal spray Commonly known as:  FLONASE 2 sprays by Each Nare route as needed   Mometasone Furoate 200 MCG/ACT Aero Commonly known as:  ASMANEX HFA Inhale 2 puffs into the lungs 2 (two) times daily. Rinse, gargle, and spit after use. Use with spacer.   montelukast 10 MG tablet Commonly known as:  SINGULAIR TAKE 1 TABLET BY MOUTH EVERYDAY AT BEDTIME   multivitamin with minerals Tabs tablet Take 1 tablet by mouth daily.   omeprazole 40 MG capsule Commonly known as:  PRILOSEC Take one capsule twice daily as directed   ranitidine 300 MG tablet Commonly known as:  ZANTAC TAKE 1 TABLET BY MOUTH EVERY EVENING   VITAMIN B-12 PO Take by mouth.       Past Medical History:  Diagnosis Date  . Cancer (HCC) 06?   vulva  . Complication of anesthesia   . LPRD (laryngopharyngeal reflux disease)   . Neuromuscular disorder ([place])    Morton's neuroma, plantar fasciitis s/p 10  . PONV (postoperative nausea and vomiting)     Past Surgical History:  Procedure Laterality Date  . CERVICAL CONIZATION W/BX  06  . CHOLECYSTECTOMY N/A 10/15/2013   Procedure: LAPAROSCOPIC CHOLECYSTECTOMY WITH INTRAOPERATIVE CHOLANGIOGRAM;  Surgeon: Donnie Mesa, MD;  Location: [facility];  Service: General;  Laterality: N/A;  .  COLONOSCOPY  12/2014   with Dr. Collene Mares next one in 5 yrs  . DIAGNOSTIC LAPAROSCOPY     endometriosis- 57 yrs old  . FOOT SURGERY Bilateral 2010,2011   For Plantar Faciitis  . FOOT SURGERY Bilateral 1992,95   For Morton's Neuroma    Review of systems negative except as noted in HPI / PMHx or noted below:  Review of Systems  Constitutional: Negative.   HENT: Negative.   Eyes: Negative.   Respiratory: Negative.   Cardiovascular: Negative.   Gastrointestinal: Negative.   Genitourinary: Negative.   Musculoskeletal: Negative.   Skin: Negative.   Neurological: Negative.    Endo/Heme/Allergies: Negative.   Psychiatric/Behavioral: Negative.      Objective:   Vitals:   06/15/17 1140  BP: 116/76  Pulse: 64  Resp: 16          Physical Exam  HENT:  Head: Normocephalic.  Right Ear: Tympanic membrane, external ear and ear canal normal.  Left Ear: Tympanic membrane, external ear and ear canal normal.  Nose: Nose normal. No mucosal edema or rhinorrhea.  Mouth/Throat: Uvula is midline, oropharynx is clear and moist and mucous membranes are normal. No oropharyngeal exudate.  Eyes: Conjunctivae are normal.  Neck: Trachea normal. No tracheal tenderness present. No tracheal deviation present. No thyromegaly present.  Cardiovascular: Normal rate, regular rhythm, S1 normal, S2 normal and normal heart sounds.  No murmur heard. Pulmonary/Chest: Breath sounds normal. No stridor. No respiratory distress. She has no wheezes. She has no rales.  Musculoskeletal: She exhibits tenderness (Deep palpation of right lateral and exterior right mid chest region elicits pain.  Light cutaneous pressure does not elicit pain.). She exhibits no edema.  Lymphadenopathy:       Head (right side): No tonsillar adenopathy present.       Head (left side): No tonsillar adenopathy present.    She has no cervical adenopathy.  Neurological: She is alert.  Skin: No rash noted. She is not diaphoretic. No erythema. Nails show no clubbing.    Diagnostics:    Spirometry was performed and demonstrated an FEV1 of 1.75 at 71 % of predicted.  The patient had an Asthma Control Test with the following results: ACT Total Score: 22.    Assessment and Plan:   1. Asthma, moderate persistent, well-controlled   2. Other allergic rhinitis   3. LPRD (laryngopharyngeal reflux disease)   4. Right-sided chest wall pain     1. Continue to perform Allergen avoidance measures  2. Continue toTreat and prevent inflammation:   A. Flonase one-2 sprays each nostril 3-7 times per week  B. montelukast 10  mg tablet 1 time per day  C. Symbicort 160 - 2 inhalations 2 times per day with spacer    3. Continue to Treat and prevent reflux:   A.  omeprazole 40 mg 2 times a day  C. ranitidine 300 mg in PM  4. If needed:      A. OTC antihistamine  B. OTC nasal saline   C. Proair HFA 2 inhalations every 4-6 hours   5. Return to clinic in 12 weeks or earlier if problem  6. Contact clinic if still with right chest pain for one additional week   I will have Claiborne Billings continue to treat inflammation of her airway and reflux in an aggressive manner as noted above at least for the next 12 weeks and if she does well during this 12-week interval there may be an opportunity to consolidate her treatment.  I do  not know the cause of her right chest wall pain and tenderness but I will give her 1 more week to heal this issue assuming that it is a musculoskeletal traumatic event that came about from her coughing and if it is still a active issue then we will image this area.  If she does well then I will see her back in this clinic in 12 weeks.  Allena Katz, MD Allergy / Immunology [place]

## 2017-06-19 ENCOUNTER — Encounter: Payer: Self-pay | Admitting: Allergy and Immunology

## 2017-06-21 ENCOUNTER — Encounter: Payer: BC Managed Care – PPO | Admitting: Obstetrics & Gynecology

## 2017-06-21 ENCOUNTER — Ambulatory Visit: Payer: BC Managed Care – PPO | Admitting: Obstetrics & Gynecology

## 2017-06-21 ENCOUNTER — Encounter: Payer: Self-pay | Admitting: Obstetrics & Gynecology

## 2017-06-21 VITALS — BP 124/80 | Ht 61.5 in | Wt 181.0 lb

## 2017-06-21 DIAGNOSIS — E6609 Other obesity due to excess calories: Secondary | ICD-10-CM

## 2017-06-21 DIAGNOSIS — Z01419 Encounter for gynecological examination (general) (routine) without abnormal findings: Secondary | ICD-10-CM

## 2017-06-21 DIAGNOSIS — Z780 Asymptomatic menopausal state: Secondary | ICD-10-CM

## 2017-06-21 DIAGNOSIS — Z6833 Body mass index (BMI) 33.0-33.9, adult: Secondary | ICD-10-CM | POA: Diagnosis not present

## 2017-06-21 NOTE — Patient Instructions (Signed)
1. Well female exam with routine gynecological exam Normal gynecologic exam.  Pap test negative with negative high risk HPV in June 2018.  Will repeat Pap test next year.  Breast exam normal.  Overdue for screening mammogram, will schedule at the breast center.  Colonoscopy normal in 2016.  Follow-up here for fasting health labs. - CBC; Future - Comp Met (CMET); Future - Lipid panel; Future - TSH; Future - VITAMIN D 25 Hydroxy (Vit-D Deficiency, Fractures); Future  2. Menopause present Well on no hormone replacement therapy.  No postmenopausal bleeding.  Vitamin D supplements, calcium rich nutrition and regular weightbearing physical activity recommended.  Will organize screening bone density next year.  3. Class 1 obesity due to excess calories without serious comorbidity with body mass index (BMI) of 33.0 to 33.9 in adult Lost 3 pounds since last year.  Continue with lower calorie/carb diet and regular physical activity with aerobic activities 5 times a week and weightlifting every 2 days.  Ashley Eaton, it was really a pleasure to see you today!  I will inform you of your results as soon as they are available.

## 2017-06-21 NOTE — Progress Notes (Signed)
Ashley Eaton 12/06/1960 [id number]   History:    57 y.o. [id number] Married.  Engineer, site school.  RP:  Established patient presenting for annual gyn exam   HPI: Menopause, well without hormone replacement therapy.  No postmenopausal bleeding.  No pelvic pain.  No pain with intercourse.  Normal vaginal secretions.  Urine normal.  No stress urinary incontinence.  Bowel movements normal.  Breasts normal.  Overdue for screening mammogram.  Body mass index 33.65, lost 3 pounds since last year.  Will follow up here for fasting health labs.  Past medical history,surgical history, family history and social history were all reviewed and documented in the EPIC chart.  Gynecologic History No LMP recorded. Patient is postmenopausal. Contraception: post menopausal status Last Pap: 06/2016. Results were: Negative/HPV HR Negative Last mammogram: 2016. Results were: Negative Bone Density: Never Colonoscopy: 2016 Dr Collene Mares  Obstetric History OB History  Gravida Para Term Preterm AB Living  '6 3     2 3  '$ SAB TAB Ectopic Multiple Live Births  1            # Outcome Date GA Lbr Len/2nd Weight Sex Delivery Anes PTL Lv  6 Gravida           5 AB           4 SAB           3 Para           2 Para           1 Para              ROS: A ROS was performed and pertinent positives and negatives are included in the history.  GENERAL: No fevers or chills. HEENT: No change in vision, no earache, sore throat or sinus congestion. NECK: No pain or stiffness. CARDIOVASCULAR: No chest pain or pressure. No palpitations. PULMONARY: No shortness of breath, cough or wheeze. GASTROINTESTINAL: No abdominal pain, nausea, vomiting or diarrhea, melena or bright red blood per rectum. GENITOURINARY: No urinary frequency, urgency, hesitancy or dysuria. MUSCULOSKELETAL: No joint or muscle pain, no back pain, no recent trauma. DERMATOLOGIC: No rash, no itching, no lesions. ENDOCRINE: No polyuria, polydipsia, no  heat or cold intolerance. No recent change in weight. HEMATOLOGICAL: No anemia or easy bruising or bleeding. NEUROLOGIC: No headache, seizures, numbness, tingling or weakness. PSYCHIATRIC: No depression, no loss of interest in normal activity or change in sleep pattern.     Exam:   BP 124/80   Ht 5' 1.5" (1.562 m)   Wt 181 lb (82.1 kg)   BMI 33.65 kg/m   Body mass index is 33.65 kg/m.  General appearance : Well developed well nourished female. No acute distress HEENT: Eyes: no retinal hemorrhage or exudates,  Neck supple, trachea midline, no carotid bruits, no thyroidmegaly Lungs: Clear to auscultation, no rhonchi or wheezes, or rib retractions  Heart: Regular rate and rhythm, no murmurs or gallops Breast:Examined in sitting and supine position were symmetrical in appearance, no palpable masses or tenderness,  no skin retraction, no nipple inversion, no nipple discharge, no skin discoloration, no axillary or supraclavicular lymphadenopathy Abdomen: no palpable masses or tenderness, no rebound or guarding Extremities: no edema or skin discoloration or tenderness  Pelvic: Vulva: Normal             Vagina: No gross lesions or discharge  Cervix: No gross lesions or discharge  Uterus  AV, normal size, shape and consistency, non-tender and mobile  Adnexa  Without masses or tenderness  Anus: Normal   Assessment/Plan:  57 y.o. female for annual exam   1. Well female exam with routine gynecological exam Normal gynecologic exam.  Pap test negative with negative high risk HPV in June 2018.  Will repeat Pap test next year.  Breast exam normal.  Overdue for screening mammogram, will schedule at the breast center.  Colonoscopy normal in 2016.  Follow-up here for fasting health labs. - CBC; Future - Comp Met (CMET); Future - Lipid panel; Future - TSH; Future - VITAMIN D 25 Hydroxy (Vit-D Deficiency, Fractures); Future  2. Menopause present Well on no hormone replacement therapy.  No  postmenopausal bleeding.  Vitamin D supplements, calcium rich nutrition and regular weightbearing physical activity recommended.  Will organize screening bone density next year.  3. Class 1 obesity due to excess calories without serious comorbidity with body mass index (BMI) of 33.0 to 33.9 in adult Lost 3 pounds since last year.  Continue with lower calorie/carb diet and regular physical activity with aerobic activities 5 times a week and weightlifting every 2 days.  Princess Bruins MD, 12:46 PM 06/21/2017

## 2017-06-26 ENCOUNTER — Other Ambulatory Visit: Payer: Self-pay | Admitting: Obstetrics & Gynecology

## 2017-06-26 DIAGNOSIS — Z1231 Encounter for screening mammogram for malignant neoplasm of breast: Secondary | ICD-10-CM

## 2017-06-27 ENCOUNTER — Other Ambulatory Visit: Payer: BC Managed Care – PPO

## 2017-06-27 DIAGNOSIS — Z01419 Encounter for gynecological examination (general) (routine) without abnormal findings: Secondary | ICD-10-CM

## 2017-06-28 ENCOUNTER — Ambulatory Visit
Admission: RE | Admit: 2017-06-28 | Discharge: 2017-06-28 | Disposition: A | Discharge status: Home / Self Care | Payer: BC Managed Care – PPO | Source: Ambulatory Visit | Attending: Obstetrics & Gynecology | Admitting: Obstetrics & Gynecology

## 2017-06-28 DIAGNOSIS — Z1231 Encounter for screening mammogram for malignant neoplasm of breast: Secondary | ICD-10-CM

## 2017-06-28 LAB — CBC
HCT: 40.4 % (ref 35.0–45.0)
Hemoglobin: 14.4 g/dL (ref 11.7–15.5)
MCH: 32.1 pg (ref 27.0–33.0)
MCHC: 35.6 g/dL (ref 32.0–36.0)
MCV: 90 fL (ref 80.0–100.0)
MPV: 9.2 fL (ref 7.5–12.5)
Platelets: 315 10*3/uL (ref 140–400)
RBC: 4.49 10*6/uL (ref 3.80–5.10)
RDW: 12.4 % (ref 11.0–15.0)
WBC: 7.7 10*3/uL (ref 3.8–10.8)

## 2017-06-28 LAB — LIPID PANEL
Cholesterol: 243 mg/dL — ABNORMAL HIGH (ref <200)
HDL: 70 mg/dL (ref >50)
LDL Cholesterol (Calc): 151 mg/dL (calc) — ABNORMAL HIGH
Non-HDL Cholesterol (Calc): 173 mg/dL (calc) — ABNORMAL HIGH (ref <130)
Total CHOL/HDL Ratio: 3.5 (calc) (ref <5.0)
Triglycerides: 103 mg/dL (ref <150)

## 2017-06-28 LAB — COMPREHENSIVE METABOLIC PANEL
AG Ratio: 1.5 (calc) (ref 1.0–2.5)
ALT: 31 U/L — ABNORMAL HIGH (ref 6–29)
AST: 21 U/L (ref 10–35)
Albumin: 4.4 g/dL (ref 3.6–5.1)
Alkaline phosphatase (APISO): 81 U/L (ref 33–130)
BUN: 19 mg/dL (ref 7–25)
CO2: 25 mmol/L (ref 20–32)
Calcium: 10 mg/dL (ref 8.6–10.4)
Chloride: 104 mmol/L (ref 98–110)
Creat: 0.86 mg/dL (ref 0.50–1.05)
Globulin: 2.9 g/dL (calc) (ref 1.9–3.7)
Glucose, Bld: 96 mg/dL (ref 65–99)
Potassium: 4.5 mmol/L (ref 3.5–5.3)
Sodium: 139 mmol/L (ref 135–146)
Total Bilirubin: 0.5 mg/dL (ref 0.2–1.2)
Total Protein: 7.3 g/dL (ref 6.1–8.1)

## 2017-06-28 LAB — VITAMIN D 25 HYDROXY (VIT D DEFICIENCY, FRACTURES): Vit D, 25-Hydroxy: 33 ng/mL (ref 30–100)

## 2017-06-28 LAB — TSH: TSH: 2.99 mIU/L

## 2017-06-30 ENCOUNTER — Other Ambulatory Visit: Payer: Self-pay | Admitting: Obstetrics & Gynecology

## 2017-06-30 DIAGNOSIS — R945 Abnormal results of liver function studies: Principal | ICD-10-CM

## 2017-06-30 DIAGNOSIS — R7989 Other specified abnormal findings of blood chemistry: Secondary | ICD-10-CM

## 2017-08-03 ENCOUNTER — Other Ambulatory Visit: Payer: Self-pay | Admitting: Allergy and Immunology

## 2017-08-17 ENCOUNTER — Other Ambulatory Visit: Payer: Self-pay | Admitting: Allergy and Immunology

## 2017-10-12 ENCOUNTER — Other Ambulatory Visit: Payer: Self-pay | Admitting: Allergy and Immunology

## 2017-10-12 ENCOUNTER — Telehealth: Payer: Self-pay | Admitting: *Deleted

## 2017-10-12 MED ORDER — FLUCONAZOLE 150 MG PO TABS: 150.0000 mg | ORAL_TABLET | Freq: Every day | ORAL | 1 refills | Status: DC

## 2017-10-12 NOTE — Telephone Encounter (Signed)
Rx sent, left on voicemail Rx sent 

## 2017-10-12 NOTE — Telephone Encounter (Signed)
Courtesy refill  

## 2017-10-12 NOTE — Telephone Encounter (Signed)
Agree with Fluconazole 150 mg 1 tab daily x 3.  #3, refill x 1.

## 2017-10-12 NOTE — Telephone Encounter (Signed)
Patient called back c/o yeast infection asked if Rx could be sent to pharmacy, reports was on antibiotic prescribed by another physician. Please advise

## 2017-10-18 ENCOUNTER — Ambulatory Visit: Payer: BC Managed Care – PPO | Admitting: Allergy and Immunology

## 2017-10-18 ENCOUNTER — Encounter: Payer: Self-pay | Admitting: Allergy and Immunology

## 2017-10-18 VITALS — BP 128/80 | HR 78 | Resp 16

## 2017-10-18 DIAGNOSIS — J4541 Moderate persistent asthma with (acute) exacerbation: Secondary | ICD-10-CM | POA: Diagnosis not present

## 2017-10-18 DIAGNOSIS — R252 Cramp and spasm: Secondary | ICD-10-CM

## 2017-10-18 DIAGNOSIS — K219 Gastro-esophageal reflux disease without esophagitis: Secondary | ICD-10-CM

## 2017-10-18 DIAGNOSIS — J3089 Other allergic rhinitis: Secondary | ICD-10-CM | POA: Diagnosis not present

## 2017-10-18 MED ORDER — MOMETASONE FUROATE 200 MCG/ACT IN AERO: 2.0000 | INHALATION_SPRAY | Freq: Two times a day (BID) | RESPIRATORY_TRACT | 0 refills | Status: DC | PRN

## 2017-10-18 MED ORDER — AZITHROMYCIN 500 MG PO TABS: ORAL_TABLET | ORAL | 0 refills | Status: DC

## 2017-10-18 NOTE — Patient Instructions (Addendum)
  1. Continue to perform Allergen avoidance measures  2. Continue toTreat and prevent inflammation:   A. Flonase one-2 sprays each nostril 3-7 times per week  B. montelukast 10 mg tablet 1 time per day  C. Symbicort 160 - 2 inhalations 2 times per day with spacer    3. Continue to Treat and prevent reflux:   A.  omeprazole 40 mg 2 times a day  C. ranitidine 300 mg in PM  4. If needed:      A. OTC antihistamine  B. OTC nasal saline   C. Proair HFA 2 inhalations every 4-6 hours  5. For this most recent episode:   A. OTC Mucinex DM 2 tablets 2 times per day  B. Add sample Asmanex 200 HFA 2 inhalations 2 times per day to Symbicort  C. Use OTC Magnesium 2 times per day for cramps  D. No more Diflucan  E. Azithromycin 500 mg one time per day for 3 days only  F. Prednisone 10mg  one time per day for 10 days only   5. Return to clinic in February 2020 or earlier if problem  6. Consider received another flu vaccine end of November 2019

## 2017-10-18 NOTE — Progress Notes (Signed)
Follow-up Note  Referring Provider: Heywood Bene, * Primary Provider: Merwyn Katos Date of Office Visit: 10/18/2017  Subjective:   Ashley Eaton (DOB: [birth year]) is a 57 y.o. female who returns to the [facility] on 10/18/2017 in re-evaluation of the following:  HPI: [name] presents to this clinic in evaluation of respiratory tract problems complicating her asthma and allergic rhinitis and LPR.  Her last visit to this clinic was 15 June 2017 at which point time she was doing relatively well regarding all of her issues.  Approximately 1 week prior to the end of September she developed headache and sore throat and cough and she went to the urgent care center and was treated with a "shot" and Cefdinir.  Now she has cough and raspy voice and her cough is disturbing her sleep.  She is making yellow-green sputum production.  She believes that her nose is doing well and she no longer has a sore throat.  She does not have any anosmia.  Her issue is been complicated by the fact that she has developed vaginal candidiasis and she has utilize 6 tablets total of Diflucan to date.  She cannot use a narcotic-based cough suppressant because she drives a schoolbus every morning.  She does not believe that the use of a short acting bronchodilator has really helped this issue very much.  She does believe that her reflux is under good control on her current plan.  She has been developing some muscle cramps in her lower extremities since she has been treated with all of these medications the past few weeks.  She did receive the flu vaccine this year on 03 October 2017 but unfortunately she received the flu vaccine 24 hours after receiving a systemic steroid injection.  Allergies as of 10/18/2017      Reactions   Amoxicillin-pot Clavulanate Other (See Comments)   Other Other (See Comments)   Synthetic pain medication makes her heart race ?name   Prednisone Other (See  Comments)   tablet form, upset stomach, states it makes her crazy      Medication List      albuterol 108 (90 Base) MCG/ACT inhaler Commonly known as:  PROVENTIL HFA;VENTOLIN HFA Inhale two puffs every 4-6 hours if needed for cough, wheeze, shortness of breath.   budesonide-formoterol 160-4.5 MCG/ACT inhaler  Commonly known as:  SYMBICORT Inhale two puffs twice daily to prevent cough or wheeze. Rinse mouth after use.   fluconazole 150 MG tablet Commonly known as:  DIFLUCAN Take 1 tablet (150 mg total) by mouth daily.   fluticasone 50 MCG/ACT nasal spray Commonly known as:  FLONASE 2 sprays by Each Nare route as needed   montelukast 10 MG tablet Commonly known as:  SINGULAIR TAKE 1 TABLET BY MOUTH EVERYDAY AT BEDTIME   multivitamin with minerals Tabs tablet Take 1 tablet by mouth daily.   omeprazole 40 MG capsule Commonly known as:  PRILOSEC Take one capsule twice daily as directed   ranitidine 300 MG tablet Commonly known as:  ZANTAC TAKE 1 TABLET BY MOUTH EVERY EVENING   VITAMIN B-12 PO Take by mouth.       Past Medical History:  Diagnosis Date  . Cancer (HCC) 06?   vulva  . Complication of anesthesia   . LPRD (laryngopharyngeal reflux disease)   . Neuromuscular disorder ([place])    Morton's neuroma, plantar fasciitis s/p 10  . PONV (postoperative nausea and vomiting)     Past  Surgical History:  Procedure Laterality Date  . CERVICAL CONIZATION W/BX  06  . CHOLECYSTECTOMY N/A 10/15/2013   Procedure: LAPAROSCOPIC CHOLECYSTECTOMY WITH INTRAOPERATIVE CHOLANGIOGRAM;  Surgeon: Donnie Mesa, MD;  Location: [facility];  Service: General;  Laterality: N/A;  . COLONOSCOPY  12/2014   with Dr. Collene Mares next one in 5 yrs  . DIAGNOSTIC LAPAROSCOPY     endometriosis- 57 yrs old  . FOOT SURGERY Bilateral 2010,2011   For Plantar Faciitis  . FOOT SURGERY Bilateral 1992,95   For Morton's Neuroma    Review of systems negative except as noted in HPI / PMHx or noted  below:  Review of Systems  Constitutional: Negative.   HENT: Negative.   Eyes: Negative.   Respiratory: Negative.   Cardiovascular: Negative.   Gastrointestinal: Negative.   Genitourinary: Negative.   Musculoskeletal: Negative.   Skin: Negative.   Neurological: Negative.   Endo/Heme/Allergies: Negative.   Psychiatric/Behavioral: Negative.      Objective:   Vitals:   10/18/17 0845  BP: 128/80  Pulse: 78  Resp: 16          Physical Exam  HENT:  Head: Normocephalic.  Right Ear: External ear and ear canal normal. A middle ear effusion is present.  Left Ear: External ear and ear canal normal. A middle ear effusion is present.  Nose: Nose normal. No mucosal edema or rhinorrhea.  Mouth/Throat: Uvula is midline, oropharynx is clear and moist and mucous membranes are normal. No oropharyngeal exudate.  Eyes: Conjunctivae are normal.  Neck: Trachea normal. No tracheal tenderness present. No tracheal deviation present. No thyromegaly present.  Cardiovascular: Normal rate, regular rhythm, S1 normal, S2 normal and normal heart sounds.  No murmur heard. Pulmonary/Chest: Breath sounds normal. No stridor. No respiratory distress. She has no wheezes. She has no rales.  Musculoskeletal: She exhibits no edema.  Lymphadenopathy:       Head (right side): No tonsillar adenopathy present.       Head (left side): No tonsillar adenopathy present.    She has no cervical adenopathy.  Neurological: She is alert.  Skin: No rash noted. She is not diaphoretic. No erythema. Nails show no clubbing.    Diagnostics:    Spirometry was performed and demonstrated an FEV1 of 1.97 at 78 % of predicted.  The patient had an Asthma Control Test with the following results: ACT Total Score: 8.    Assessment and Plan:   1. Asthma, not well controlled, moderate persistent, with acute exacerbation   2. Other allergic rhinitis   3. LPRD (laryngopharyngeal reflux disease)   4. Muscle cramps     1.  Continue to perform Allergen avoidance measures  2. Continue toTreat and prevent inflammation:   A. Flonase one-2 sprays each nostril 3-7 times per week  B. montelukast 10 mg tablet 1 time per day  C. Symbicort 160 - 2 inhalations 2 times per day with spacer    3. Continue to Treat and prevent reflux:   A.  omeprazole 40 mg 2 times a day  C. ranitidine 300 mg in PM  4. If needed:      A. OTC antihistamine  B. OTC nasal saline   C. Proair HFA 2 inhalations every 4-6 hours  5. For this most recent episode:   A. OTC Mucinex DM 2 tablets 2 times per day  B. Add sample Asmanex 200 HFA 2 inhalations 2 times per day to Symbicort  C. Use OTC Magnesium 2 times per day for cramps  D.  No more Diflucan  E. Azithromycin 500 mg one time per day for 3 days only  F. Prednisone 10mg  one time per day for 10 days only   5. Return to clinic in February 2020 or earlier if problem  6. Consider received another flu vaccine end of November 2019   Kinsleigh continues to cough in the face of using a cephalosporin and a injection of systemic steroids administered at the beginning of this month at the urgent care center and she is making yellow-green sputum and thus I will treat her with azithromycin to cover possible mycoplasma infection.  I will also give her a very low dose of systemic steroids as noted above and there is probably no more need for additional Diflucan as 6 tablets should load up her body pretty well with fluconazole.  Assuming she does well I will see her back in this clinic in February 2020 or earlier if there is a problem.  Allena Katz, MD Allergy / Immunology [place]

## 2017-10-19 ENCOUNTER — Encounter: Payer: Self-pay | Admitting: Allergy and Immunology

## 2017-11-04 ENCOUNTER — Other Ambulatory Visit: Payer: Self-pay | Admitting: Allergy and Immunology

## 2018-01-09 ENCOUNTER — Ambulatory Visit: Payer: BC Managed Care – PPO | Admitting: Allergy

## 2018-01-09 ENCOUNTER — Encounter: Payer: Self-pay | Admitting: Allergy

## 2018-01-09 VITALS — BP 122/78 | HR 69 | Temp 97.7°F | Resp 18

## 2018-01-09 DIAGNOSIS — J3089 Other allergic rhinitis: Secondary | ICD-10-CM | POA: Diagnosis not present

## 2018-01-09 DIAGNOSIS — L309 Dermatitis, unspecified: Secondary | ICD-10-CM | POA: Diagnosis not present

## 2018-01-09 DIAGNOSIS — K219 Gastro-esophageal reflux disease without esophagitis: Secondary | ICD-10-CM

## 2018-01-09 DIAGNOSIS — H1013 Acute atopic conjunctivitis, bilateral: Secondary | ICD-10-CM

## 2018-01-09 DIAGNOSIS — J4540 Moderate persistent asthma, uncomplicated: Secondary | ICD-10-CM

## 2018-01-09 MED ORDER — OLOPATADINE HCL 0.1 % OP SOLN: OPHTHALMIC | 5 refills | Status: DC

## 2018-01-09 MED ORDER — ALBUTEROL SULFATE HFA 108 (90 BASE) MCG/ACT IN AERS: INHALATION_SPRAY | RESPIRATORY_TRACT | 1 refills | Status: DC

## 2018-01-09 NOTE — Progress Notes (Signed)
Follow-up Note  RE: Ashley Eaton MRN: [id number] DOB: 19-Oct-1960 Date of Office Visit: 01/09/2018   History of present illness: Ashley Eaton is a 58 y.o. female presenting today for sick visit for rash.  She was last seen in the office on October 18, 2017 by Dr. Neldon Mc for history of asthma, allergic rhinitis, LPRD.  She states around Christmas time she started to develop red bumps on her face and cheeks that will pop up and not really go away.  She states she has continued to have more and more pop up since this time on her face and is now extending to her neck and upper chest.  She states she is itchy as well as noticing itchiness of her nose as well as her eyes.  She also has noticed that her eyes have become red as well as itchy.  She states that this rash she has never had before.  The rash itself is not itchy over the bumps but states that if she pushes on the bump that it hurts a little bit.  She states that if she pops the bumps a clear liquid could ooze.  She states it does seem to be scarring.  She has gotten more concerned as the bumps seem to be getting closer and closer to her eye.  She is also worried with the appearance that she is around a lot of small children.  She states that she has been using Benadryl on rare occasions for this but this makes her sleepy and she also feels it makes her feel very weird and a bit nauseous.  She states that over Christmas break she was watching her grandchildren; states that 1 of her grandchildren was carrying around a blanket everywhere that she knows had exposure to dogs.  She states she is dog allergic.  She denies any fevers.  She states she did start a new soap product around the onset of the symptoms but she has stopped this.  She also states that she was sleeping on new sheets around the time this started but she has changed the sheets as well.  The rash has continued to progress despite stopping those things previously. She has had  shingles before and states that this rash is very different from when she had the shingles rash and that this current rash is not painful like when she had the shingles rash.   Review of systems: ROS  All other systems negative unless noted above in HPI  Past medical/social/surgical/family history have been reviewed and are unchanged unless specifically indicated below.  No changes  Medication List: Allergies as of 01/09/2018      Reactions   Amoxicillin-pot Clavulanate Other (See Comments)   Other Other (See Comments)   Synthetic pain medication makes her heart race ?name   Prednisone Other (See Comments)   tablet form, upset stomach, states it makes her crazy      Medication List       Accurate as of January 09, 2018  1:33 PM. Always use your most recent med list.        albuterol 108 (90 Base) MCG/ACT inhaler Commonly known as:  PROVENTIL HFA;VENTOLIN HFA Inhale two puffs every four to six hours as needed for cough or wheeze.   budesonide-formoterol 160-4.5 MCG/ACT inhaler Commonly known as:  SYMBICORT Inhale two puffs twice daily to prevent cough or wheeze. Rinse mouth after use.   fluticasone 50 MCG/ACT nasal spray Commonly known as:  FLONASE  2 sprays by Each Nare route as needed   Mometasone Furoate 200 MCG/ACT Aero Commonly known as:  ASMANEX HFA Inhale 2 puffs into the lungs 2 (two) times daily as needed (during asthma flare-up.  Rinse, gargle, and spit after use.).   montelukast 10 MG tablet Commonly known as:  SINGULAIR TAKE 1 TABLET BY MOUTH EVERYDAY AT BEDTIME   multivitamin with minerals Tabs tablet Take 1 tablet by mouth daily.   olopatadine 0.1 % ophthalmic solution Commonly known as:  PATANOL Can use one drop in each eye twice daily if needed for itchy, watery, red eyes.   omeprazole 40 MG capsule Commonly known as:  PRILOSEC Take one capsule twice daily as directed   ranitidine 300 MG tablet Commonly known as:  ZANTAC TAKE 1 TABLET BY MOUTH  EVERY EVENING   VITAMIN B-12 PO Take by mouth.       Known medication allergies: Allergies  Allergen Reactions  . Amoxicillin-Pot Clavulanate Other (See Comments)  . Other Other (See Comments)    Synthetic pain medication makes her heart race ?name  . Prednisone Other (See Comments)    tablet form, upset stomach, states it makes her crazy     Physical examination: Blood pressure 122/78, pulse 69, temperature 97.7 F (36.5 C), temperature source Oral, resp. rate 18.  General: Alert, interactive, in no acute distress. HEENT: PERRLA left eye with slight scleral injection and tearing, TMs pearly gray, turbinates minimally edematous without discharge, post-pharynx non erythematous. Neck: Supple without lymphadenopathy. Lungs: Clear to auscultation without wheezing, rhonchi or rales. {no increased work of breathing. CV: Normal S1, S2 without murmurs. Abdomen: Nondistended, nontender. Skin: Several erythematous papules over bilateral cheeks, neck and upper chest.  Several of the papules are near the lateral corners of her eyes bilaterally. Extremities:  No clubbing, cyanosis or edema. Neuro:   Grossly intact.  Diagnositics/Labs: None today   Assessment and plan:   Dermatitis with allergic rhinitis and conjunctivitis-unclear as to the exact etiology of this rash.  It has some consistencies with urticaria however urticaria is not usually vesicular and urticaria typically resolves.  I do not feel that this is a shingles outbreak as the rashes not dermatomal nor is it painful and she has other symptoms that are less consistent with shingles outbreak.  The fact that she also has nasal and eye itch as well as her red eyes makes this seem more like it is driven by allergen.  She has had shingles before and agrees that this is not similar to her previous shingles outbreak but I have advised her to call us as soon as possible if this rash is worsening.  I will treat this as if it is allergen  driven and have provided her with Georga Hacking that can be applied to the face and neck as well as an antihistamine based eyedrop and have advised her to take Allegra instead of Benadryl at this time.  She will get back in to be seen if the rash and symptoms are not getting any better. Mod persistent asthma - continue current regimen LPRD- continue current regimen  1. Continue to perform Allergen avoidance measures  2. Continue toTreat and prevent inflammation:   A. Flonase one-2 sprays each nostril 3-7 times per week  B. montelukast 10 mg tablet 1 time per day  C. Symbicort 160 - 2 inhalations 2 times per day with spacer    3. Continue to Treat and prevent reflux:   A.  omeprazole 40 mg 2 times  a day  C. ranitidine 300 mg in PM  4. If needed:      A. OTC nasal saline   B. Proair HFA 2 inhalations every 4-6 hours  5. For this most recent episode with rash and itch:   A. Use Eucrisa on rash twice a day until improved.  Safe to use on face and body.  This is a non-steroidal cream.   B. Take Allegra 180mg  daily for help with itch.  This replaces benadryl use.    C.  Patanol 1 drop each eye up to twice a day as needed for itchy/watery/red eyes   D.  Let us know right away if rash is worsening or becoming painful  Follow-up 3-4 months or sooner if needed    I appreciate the opportunity to take part in [facility] care. Please do not hesitate to contact me with questions.  Sincerely,   Prudy Feeler, MD Allergy/Immunology [facility]

## 2018-01-09 NOTE — Patient Instructions (Addendum)
1. Continue to perform Allergen avoidance measures  2. Continue toTreat and prevent inflammation:   A. Flonase one-2 sprays each nostril 3-7 times per week  B. montelukast 10 mg tablet 1 time per day  C. Symbicort 160 - 2 inhalations 2 times per day with spacer    3. Continue to Treat and prevent reflux:   A.  omeprazole 40 mg 2 times a day  C. ranitidine 300 mg in PM  4. If needed:      A. OTC nasal saline   B. Proair HFA 2 inhalations every 4-6 hours  5. For this most recent episode with rash and itch:   A. Use Eucrisa on rash twice a day until improved.  Safe to use on face and body.  This is a non-steroidal cream.   B. Take Allegra 180mg  daily for help with itch.  This replaces benadryl use.    C.  Patanol 1 drop each eye up to twice a day as needed for itchy/watery/red eyes   D.  Let us know right away if rash is worsening or becoming painful  Follow-up 3-4 months or sooner if needed

## 2018-01-11 ENCOUNTER — Other Ambulatory Visit: Payer: Self-pay | Admitting: Allergy and Immunology

## 2018-01-25 ENCOUNTER — Ambulatory Visit: Payer: BC Managed Care – PPO | Admitting: Allergy and Immunology

## 2018-01-25 ENCOUNTER — Encounter: Payer: Self-pay | Admitting: Allergy and Immunology

## 2018-01-25 VITALS — BP 132/92 | HR 74 | Temp 97.8°F | Resp 16

## 2018-01-25 DIAGNOSIS — K219 Gastro-esophageal reflux disease without esophagitis: Secondary | ICD-10-CM

## 2018-01-25 DIAGNOSIS — L308 Other specified dermatitis: Secondary | ICD-10-CM | POA: Diagnosis not present

## 2018-01-25 DIAGNOSIS — J4540 Moderate persistent asthma, uncomplicated: Secondary | ICD-10-CM

## 2018-01-25 DIAGNOSIS — J3089 Other allergic rhinitis: Secondary | ICD-10-CM

## 2018-01-25 DIAGNOSIS — L989 Disorder of the skin and subcutaneous tissue, unspecified: Secondary | ICD-10-CM

## 2018-01-25 MED ORDER — METRONIDAZOLE 0.75 % EX CREA: TOPICAL_CREAM | CUTANEOUS | 5 refills | Status: DC

## 2018-01-25 MED ORDER — FLUTICASONE PROPIONATE 50 MCG/ACT NA SUSP: NASAL | 5 refills | Status: DC

## 2018-01-25 NOTE — Patient Instructions (Addendum)
  1. Continue to perform Allergen avoidance measures  2. Continue toTreat and prevent inflammation:   A. Flonase one-2 sprays each nostril 3-7 times per week  B. montelukast 10 mg tablet 1 time per day  C. Symbicort 160 - 2 inhalations 2 times per day with spacer    3. Continue to Treat and prevent reflux:   A.  omeprazole 40 mg 2 times a day  C. ranitidine 300 mg in PM  4. If needed:      A. OTC antihistamine  B. OTC nasal saline   C. Proair HFA 2 inhalations every 4-6 hours  5. "Action Plan" for flare up:   A. Add sample Asmanex 200 HFA 2 inhalations 2 times per day   B. OTC Mucinex DM 2 tablets 2 times per day  C. ProAir HFA if needed   5. Start Metrocream applied to face two time per day  6. Visit with [facility] for facial rash  7. Return to clinic in summer 2020 of earlier if problem

## 2018-01-25 NOTE — Progress Notes (Signed)
Follow-up Note  Referring Provider: Heywood Bene, * Primary Provider: Merwyn Katos Date of Office Visit: 01/25/2018  Subjective:   Ashley Eaton (DOB: [birth year]) is a 58 y.o. female who returns to the [facility] on 01/25/2018 in re-evaluation of the following:  HPI: Ashley Eaton presents to this clinic in evaluation of 2 distinct issues.  I follow her in this clinic for asthma and allergic rhinitis and LPR and her last visit with me in this clinic was 18 October 2017.  She did visit with Dr. Nelva Bush on 09 January 2022 with itchy dermatitis affecting her face.  She was treated with [place] which did nothing but slowly her reaction resolved and it returned this weekend.  Once again it is intensely itchy and only involves her cheeks for the most part.  There is no associated systemic or constitutional symptoms.  As well, yesterday she developed coughing and some chest tightness and achiness in her arms and back and she went to the urgent care center yesterday and they documented a red ear and gave her amoxicillin.  Her daughter is at home with a sore throat and achiness starting yesterday.  Her asthma and her allergic rhinitis were under excellent control up until this event.  She did not require systemic steroid or an antibiotic for any type of respiratory tract issue.  She rarely used a short acting bronchodilator.  She thought that her reflux was under good control as well.  Allergies as of 01/25/2018      Reactions   Amoxicillin-pot Clavulanate Other (See Comments)   Hurts stomach   Other Other (See Comments)   Synthetic pain medication makes her heart race ?name   Prednisone Other (See Comments)   tablet form, upset stomach, states it makes her crazy      Medication List      albuterol 108 (90 Base) MCG/ACT inhaler Commonly known as:  PROVENTIL HFA;VENTOLIN HFA Inhale two puffs every four to six hours as needed for cough or wheeze.     amoxicillin 500 MG capsule Commonly known as:  AMOXIL   benzonatate 100 MG capsule Commonly known as:  TESSALON   budesonide-formoterol 160-4.5 MCG/ACT inhaler Commonly known as:  SYMBICORT Inhale two puffs twice daily to prevent cough or wheeze. Rinse mouth after use.   fluticasone 50 MCG/ACT nasal spray Commonly known as:  FLONASE 2 sprays by Each Nare route as needed   Mometasone Furoate 200 MCG/ACT Aero Commonly known as:  ASMANEX HFA Inhale 2 puffs into the lungs 2 (two) times daily as needed (during asthma flare-up.  Rinse, gargle, and spit after use.).   montelukast 10 MG tablet Commonly known as:  SINGULAIR TAKE 1 TABLET BY MOUTH EVERYDAY AT BEDTIME   multivitamin with minerals Tabs tablet Take 1 tablet by mouth daily.   olopatadine 0.1 % ophthalmic solution Commonly known as:  PATANOL Can use one drop in each eye twice daily if needed for itchy, watery, red eyes.   omeprazole 40 MG capsule Commonly known as:  PRILOSEC TAKE ONE CAPSULE TWICE DAILY AS DIRECTED   ranitidine 300 MG tablet Commonly known as:  ZANTAC TAKE 1 TABLET BY MOUTH EVERY EVENING   VITAMIN B-12 PO Take by mouth.       Past Medical History:  Diagnosis Date  . Asthma   . Cancer (HCC) 06?   vulva  . Complication of anesthesia   . LPRD (laryngopharyngeal reflux disease)   . Neuromuscular disorder ([place])  Morton's neuroma, plantar fasciitis s/p 10  . PONV (postoperative nausea and vomiting)     Past Surgical History:  Procedure Laterality Date  . CERVICAL CONIZATION W/BX  06  . CHOLECYSTECTOMY N/A 10/15/2013   Procedure: LAPAROSCOPIC CHOLECYSTECTOMY WITH INTRAOPERATIVE CHOLANGIOGRAM;  Surgeon: Donnie Mesa, MD;  Location: [place] [facility];  Service: General;  Laterality: N/A;  . COLONOSCOPY  12/2014   with Dr. Collene Mares next one in 5 yrs  . DIAGNOSTIC LAPAROSCOPY     endometriosis- 58 yrs old  . FOOT SURGERY Bilateral 2010,2011   For Plantar Faciitis  . FOOT SURGERY Bilateral 1992,95   For  Morton's Neuroma    Review of systems negative except as noted in HPI / PMHx or noted below:  Review of Systems  Constitutional: Negative.   HENT: Negative.   Eyes: Negative.   Respiratory: Negative.   Cardiovascular: Negative.   Gastrointestinal: Negative.   Genitourinary: Negative.   Musculoskeletal: Negative.   Skin: Negative.   Neurological: Negative.   Endo/Heme/Allergies: Negative.   Psychiatric/Behavioral: Negative.      Objective:   Vitals:   01/25/18 1013  BP: (!) 132/92  Pulse: 74  Resp: 16  Temp: 97.8 F (36.6 C)  SpO2: 98%          Physical Exam Constitutional:      Appearance: She is not diaphoretic.  HENT:     Head: Normocephalic.     Right Ear: Tympanic membrane, ear canal and external ear normal.     Left Ear: Tympanic membrane, ear canal and external ear normal.     Nose: Nose normal. No mucosal edema or rhinorrhea.     Mouth/Throat:     Pharynx: Uvula midline. No oropharyngeal exudate.  Eyes:     Conjunctiva/sclera: Conjunctivae normal.  Neck:     Thyroid: No thyromegaly.     Trachea: Trachea normal. No tracheal tenderness or tracheal deviation.  Cardiovascular:     Rate and Rhythm: Normal rate and regular rhythm.     Heart sounds: Normal heart sounds, S1 normal and S2 normal. No murmur.  Pulmonary:     Effort: No respiratory distress.     Breath sounds: Normal breath sounds. No stridor. No wheezing or rales.  Lymphadenopathy:     Head:     Right side of head: No tonsillar adenopathy.     Left side of head: No tonsillar adenopathy.     Cervical: No cervical adenopathy.  Skin:    Findings: No erythema or rash (Circumferential cystic erythematous lesions numbering approximately 8 each cheek on the base of erythematous skin).     Nails: There is no clubbing.   Neurological:     Mental Status: She is alert.     Diagnostics:    Spirometry was performed and demonstrated an FEV1 of 2.25 at 101 % of predicted.  Assessment and Plan:     1. Asthma, moderate persistent, well-controlled   2. Other allergic rhinitis   3. LPRD (laryngopharyngeal reflux disease)   4. Inflammatory dermatosis     1. Continue to perform Allergen avoidance measures  2. Continue toTreat and prevent inflammation:   A. Flonase one-2 sprays each nostril 3-7 times per week  B. montelukast 10 mg tablet 1 time per day  C. Symbicort 160 - 2 inhalations 2 times per day with spacer    3. Continue to Treat and prevent reflux:   A.  omeprazole 40 mg 2 times a day  C. ranitidine 300 mg in PM  4. If  needed:      A. OTC antihistamine  B. OTC nasal saline   C. Proair HFA 2 inhalations every 4-6 hours  5. "Action Plan" for flare up:   A. Add sample Asmanex 200 HFA 2 inhalations 2 times per day   B. OTC Mucinex DM 2 tablets 2 times per day  C. ProAir HFA if needed   5. Start Metrocream applied to face two time per day  6. Visit with [facility] for facial rash  7. Return to clinic in summer 2020 of earlier if problem   Baelynn definitely has some form of inflammatory dermatosis and we will refer her to [facility] dermatology for further evaluation of this issue but empirically treat her for rosacea with MetroCream.  She also appears to have a viral respiratory tract infection and I am going to have her activate her action plan with the hope that this will not stop progression in to something very significant requiring any additional treatment.  She will keep in contact with me noting her response to this approach.  If she does well I will see her back in this clinic in summer 2020 or earlier if there is a problem.  Allena Katz, MD Allergy / Immunology [place]

## 2018-01-29 ENCOUNTER — Encounter: Payer: Self-pay | Admitting: Allergy and Immunology

## 2018-01-30 ENCOUNTER — Telehealth: Payer: Self-pay | Admitting: *Deleted

## 2018-01-30 NOTE — Telephone Encounter (Signed)
[facility] replied with referral with an appt on 02/17.

## 2018-02-24 ENCOUNTER — Other Ambulatory Visit: Payer: Self-pay | Admitting: Allergy

## 2018-03-02 ENCOUNTER — Telehealth: Payer: Self-pay

## 2018-03-02 MED ORDER — BUDESONIDE-FORMOTEROL FUMARATE 160-4.5 MCG/ACT IN AERO: INHALATION_SPRAY | RESPIRATORY_TRACT | 5 refills | Status: DC

## 2018-03-02 NOTE — Telephone Encounter (Signed)
Pharmacy request to refill Symbicort approved.

## 2018-04-11 ENCOUNTER — Telehealth: Payer: Self-pay

## 2018-04-11 MED ORDER — FAMOTIDINE 20 MG PO TABS: 20.0000 mg | ORAL_TABLET | Freq: Two times a day (BID) | ORAL | 0 refills | Status: DC

## 2018-04-11 NOTE — Addendum Note (Signed)
Addended by: Lytle Michaels A on: 04/11/2018 04:48 PM   Modules accepted: Orders

## 2018-04-11 NOTE — Telephone Encounter (Signed)
Replacement sent to pharmacy.

## 2018-04-11 NOTE — Telephone Encounter (Signed)
Patient is requesting a replacement rx for Ranitidine.  Okay to send in [place]?

## 2018-04-11 NOTE — Telephone Encounter (Signed)
Famotidine 40mg  to replace ranitidine

## 2018-05-09 ENCOUNTER — Other Ambulatory Visit: Payer: Self-pay | Admitting: Allergy and Immunology

## 2018-06-12 ENCOUNTER — Other Ambulatory Visit: Payer: Self-pay

## 2018-06-12 NOTE — Telephone Encounter (Signed)
CE scheduled 06/27/18

## 2018-06-14 MED ORDER — FLUCONAZOLE 150 MG PO TABS: 150.0000 mg | ORAL_TABLET | Freq: Every day | ORAL | 2 refills | Status: AC

## 2018-06-25 ENCOUNTER — Encounter: Payer: BC Managed Care – PPO | Admitting: Obstetrics & Gynecology

## 2018-06-26 ENCOUNTER — Other Ambulatory Visit: Payer: Self-pay

## 2018-06-27 ENCOUNTER — Encounter: Payer: Self-pay | Admitting: Obstetrics & Gynecology

## 2018-06-27 ENCOUNTER — Other Ambulatory Visit: Payer: Self-pay | Admitting: Obstetrics & Gynecology

## 2018-06-27 ENCOUNTER — Ambulatory Visit: Payer: BC Managed Care – PPO | Admitting: Obstetrics & Gynecology

## 2018-06-27 VITALS — BP 126/84 | Ht 61.5 in | Wt 168.0 lb

## 2018-06-27 DIAGNOSIS — Z6831 Body mass index (BMI) 31.0-31.9, adult: Secondary | ICD-10-CM

## 2018-06-27 DIAGNOSIS — E6609 Other obesity due to excess calories: Secondary | ICD-10-CM | POA: Diagnosis not present

## 2018-06-27 DIAGNOSIS — Z1231 Encounter for screening mammogram for malignant neoplasm of breast: Secondary | ICD-10-CM

## 2018-06-27 DIAGNOSIS — Z780 Asymptomatic menopausal state: Secondary | ICD-10-CM

## 2018-06-27 DIAGNOSIS — Z01419 Encounter for gynecological examination (general) (routine) without abnormal findings: Secondary | ICD-10-CM | POA: Diagnosis not present

## 2018-06-27 NOTE — Patient Instructions (Signed)
1. Encounter for routine gynecological examination with Papanicolaou smear of cervix Normal gynecologic exam.  Pap reflex done.  Breast exam normal.  Will schedule screening mammogram now, last mammogram June 2019 was negative.  Colonoscopy in 2016 with benign polyps, will schedule repeat colonoscopy at 5 years.  Health labs with family physician.  2. Postmenopause Well on no hormone replacement therapy.  No postmenopausal bleeding.  Vitamin D supplements, calcium intake of 1200 mg daily and regular weightbearing physical activity is recommended.  3. Class 1 obesity due to excess calories without serious comorbidity with body mass index (BMI) of 31.0 to 31.9 in adult 13 pound weight loss since last year with decrease portions and eating more vegetables.  Enjoys being physically active with yard work especially.  Other orders - minocycline (DYNACIN) 100 MG tablet; Take 100 mg by mouth 2 (two) times daily.  Ashley Eaton, it was a pleasure seeing you today!  I will inform you of your results as soon as they are available.

## 2018-06-27 NOTE — Progress Notes (Signed)
Ashley Eaton 08-28-1960 [id number]   History:    58 y.o. [id number]  Married  RP:  Established patient presenting for annual gyn exam   HPI: Post menopause, well on no hormone replacement therapy.  No postmenopausal bleeding.  No pelvic pain.  No pain with intercourse.  Urine and bowel movements normal.  Breast normal.  Body mass index 31.23.  Lost 13 Lbs x last year.  Decreasing portions and eating more vegetables.  Physically active with yard work especially.  Health labs with family physician.  Past medical history,surgical history, family history and social history were all reviewed and documented in the EPIC chart.  Gynecologic History No LMP recorded. Patient is postmenopausal. Contraception: post menopausal status Last Pap: 06/2016. Results were: Negative/HPV HR negative Last mammogram: 06/2017. Results were: Negative Bone Density: Never Colonoscopy: 2016 benign polyps.  Repeat at 5 yrs.  Obstetric History OB History  Gravida Para Term Preterm AB Living  6 3     2 3   SAB TAB Ectopic Multiple Live Births  1            # Outcome Date GA Lbr Len/2nd Weight Sex Delivery Anes PTL Lv  6 Gravida           5 AB           4 SAB           3 Para           2 Para           1 Para              ROS: A ROS was performed and pertinent positives and negatives are included in the history.  GENERAL: No fevers or chills. HEENT: No change in vision, no earache, sore throat or sinus congestion. NECK: No pain or stiffness. CARDIOVASCULAR: No chest pain or pressure. No palpitations. PULMONARY: No shortness of breath, cough or wheeze. GASTROINTESTINAL: No abdominal pain, nausea, vomiting or diarrhea, melena or bright red blood per rectum. GENITOURINARY: No urinary frequency, urgency, hesitancy or dysuria. MUSCULOSKELETAL: No joint or muscle pain, no back pain, no recent trauma. DERMATOLOGIC: No rash, no itching, no lesions. ENDOCRINE: No polyuria, polydipsia, no heat or cold intolerance. No  recent change in weight. HEMATOLOGICAL: No anemia or easy bruising or bleeding. NEUROLOGIC: No headache, seizures, numbness, tingling or weakness. PSYCHIATRIC: No depression, no loss of interest in normal activity or change in sleep pattern.     Exam:   BP 126/84   Ht 5' 1.5" (1.562 m)   Wt 168 lb (76.2 kg)   BMI 31.23 kg/m   Body mass index is 31.23 kg/m.  General appearance : Well developed well nourished female. No acute distress HEENT: Eyes: no retinal hemorrhage or exudates,  Neck supple, trachea midline, no carotid bruits, no thyroidmegaly Lungs: Clear to auscultation, no rhonchi or wheezes, or rib retractions  Heart: Regular rate and rhythm, no murmurs or gallops Breast:Examined in sitting and supine position were symmetrical in appearance, no palpable masses or tenderness,  no skin retraction, no nipple inversion, no nipple discharge, no skin discoloration, no axillary or supraclavicular lymphadenopathy Abdomen: no palpable masses or tenderness, no rebound or guarding Extremities: no edema or skin discoloration or tenderness  Pelvic: Vulva: Normal             Vagina: No gross lesions or discharge  Cervix: No gross lesions or discharge.  Pap reflex done.  Uterus  AV, normal size, shape and  consistency, non-tender and mobile  Adnexa  Without masses or tenderness  Anus: Normal   Assessment/Plan:  58 y.o. female for annual exam   1. Encounter for routine gynecological examination with Papanicolaou smear of cervix Normal gynecologic exam.  Pap reflex done.  Breast exam normal.  Will schedule screening mammogram now, last mammogram June 2019 was negative.  Colonoscopy in 2016 with benign polyps, will schedule repeat colonoscopy at 5 years.  Health labs with family physician.  2. Postmenopause Well on no hormone replacement therapy.  No postmenopausal bleeding.  Vitamin D supplements, calcium intake of 1200 mg daily and regular weightbearing physical activity is recommended.   3. Class 1 obesity due to excess calories without serious comorbidity with body mass index (BMI) of 31.0 to 31.9 in adult 13 pound weight loss since last year with decrease portions and eating more vegetables.  Enjoys being physically active with yard work especially.  Other orders - minocycline (DYNACIN) 100 MG tablet; Take 100 mg by mouth 2 (two) times daily.  Princess Bruins MD, 12:41 PM 06/27/2018

## 2018-06-28 LAB — PAP IG W/ RFLX HPV ASCU

## 2018-07-08 ENCOUNTER — Other Ambulatory Visit: Payer: Self-pay | Admitting: Allergy and Immunology

## 2018-07-09 ENCOUNTER — Telehealth: Payer: Self-pay | Admitting: Allergy and Immunology

## 2018-07-09 ENCOUNTER — Other Ambulatory Visit: Payer: Self-pay | Admitting: Allergy and Immunology

## 2018-07-09 ENCOUNTER — Other Ambulatory Visit: Payer: Self-pay | Admitting: *Deleted

## 2018-07-09 MED ORDER — ASMANEX HFA 200 MCG/ACT IN AERO: 2.0000 | INHALATION_SPRAY | Freq: Two times a day (BID) | RESPIRATORY_TRACT | 0 refills | Status: DC | PRN

## 2018-07-09 NOTE — Telephone Encounter (Signed)
Left message for patient to call the office. Please inform patient that pharmacy asked [place] to change the Asmanex to Pulmicort 180 due to insurance coverage.  Dr. Neldon Mc, can use Pulmicort 180- inhale two doses twice daily during asthma flare-up; rinse, gargle, and spit after use.  [place] [facility] to [facility].

## 2018-07-09 NOTE — Telephone Encounter (Signed)
*  Per Dr.Kozlow

## 2018-07-09 NOTE — Telephone Encounter (Signed)
Please fill this pulmicort prescription

## 2018-07-09 NOTE — Telephone Encounter (Signed)
rx sent

## 2018-07-09 NOTE — Telephone Encounter (Signed)
Dr.Kozlow, Pharmacy requests alternative for Asmanex... they suggest Pulmicort. Please advise.

## 2018-07-09 NOTE — Telephone Encounter (Signed)
Ashley Eaton would like a prescription for [facility] [place]  [facility]

## 2018-07-10 NOTE — Telephone Encounter (Signed)
Najae informed

## 2018-07-16 ENCOUNTER — Other Ambulatory Visit: Payer: Self-pay

## 2018-07-16 ENCOUNTER — Ambulatory Visit
Admission: RE | Admit: 2018-07-16 | Discharge: 2018-07-16 | Disposition: A | Discharge status: Home / Self Care | Payer: BC Managed Care – PPO | Source: Ambulatory Visit | Attending: Obstetrics & Gynecology | Admitting: Obstetrics & Gynecology

## 2018-07-16 DIAGNOSIS — Z1231 Encounter for screening mammogram for malignant neoplasm of breast: Secondary | ICD-10-CM

## 2018-07-21 ENCOUNTER — Other Ambulatory Visit: Payer: Self-pay | Admitting: Allergy and Immunology

## 2018-08-12 ENCOUNTER — Other Ambulatory Visit: Payer: Self-pay | Admitting: Allergy and Immunology

## 2018-09-19 ENCOUNTER — Ambulatory Visit (INDEPENDENT_AMBULATORY_CARE_PROVIDER_SITE_OTHER): Payer: BC Managed Care – PPO

## 2018-09-19 ENCOUNTER — Other Ambulatory Visit: Payer: Self-pay

## 2018-09-19 ENCOUNTER — Encounter: Payer: Self-pay | Admitting: Sports Medicine

## 2018-09-19 ENCOUNTER — Other Ambulatory Visit: Payer: Self-pay | Admitting: Sports Medicine

## 2018-09-19 ENCOUNTER — Ambulatory Visit: Payer: BC Managed Care – PPO | Admitting: Sports Medicine

## 2018-09-19 VITALS — BP 86/60

## 2018-09-19 DIAGNOSIS — M778 Other enthesopathies, not elsewhere classified: Secondary | ICD-10-CM

## 2018-09-19 DIAGNOSIS — M25572 Pain in left ankle and joints of left foot: Secondary | ICD-10-CM

## 2018-09-19 DIAGNOSIS — I739 Peripheral vascular disease, unspecified: Secondary | ICD-10-CM | POA: Diagnosis not present

## 2018-09-19 DIAGNOSIS — M25579 Pain in unspecified ankle and joints of unspecified foot: Secondary | ICD-10-CM

## 2018-09-19 DIAGNOSIS — M25571 Pain in right ankle and joints of right foot: Secondary | ICD-10-CM

## 2018-09-19 DIAGNOSIS — M7751 Other enthesopathy of right foot: Secondary | ICD-10-CM | POA: Diagnosis not present

## 2018-09-19 DIAGNOSIS — M79672 Pain in left foot: Secondary | ICD-10-CM

## 2018-09-19 DIAGNOSIS — G8929 Other chronic pain: Secondary | ICD-10-CM

## 2018-09-19 DIAGNOSIS — M79671 Pain in right foot: Secondary | ICD-10-CM

## 2018-09-19 DIAGNOSIS — I872 Venous insufficiency (chronic) (peripheral): Secondary | ICD-10-CM

## 2018-09-19 NOTE — Progress Notes (Signed)
Subjective:  Ashley Eaton is a 58 y.o. female patient who presents to office for evaluation of bilateral ankle pain. Patient complains of continued pain in the ankles since May.  Patient reports that her ankles pop a couple of months ago and had pain but most significantly has had issues with discoloration redness warmth and swelling to both legs has saw her primary care doctor who gave her steroid cream to use on the areas and recommended Lasix losing weight and compression stockings with no relief in symptoms. Patient denies any other pedal complaints. Denies injury/trip/fall/sprain/any causative factors.   Reports that she had a ultrasound done that did not show any blood clot at the hospital.  Review of Systems  All other systems reviewed and are negative.    Patient Active Problem List   Diagnosis Date Noted  . Hyperlipidemia 12/21/2012  . Chest pain 11/10/2012  . Hypokalemia 11/10/2012    Current Outpatient Medications on File Prior to Visit  Medication Sig Dispense Refill  . albuterol (PROVENTIL HFA;VENTOLIN HFA) 108 (90 Base) MCG/ACT inhaler INHALE TWO PUFFS EVERY FOUR TO SIX HOURS AS NEEDED FOR COUGH OR WHEEZE. 6.7 Inhaler 1  . budesonide-formoterol (SYMBICORT) 160-4.5 MCG/ACT inhaler Inhale two puffs twice daily to prevent cough or wheeze. Rinse mouth after use. 1 Inhaler 5  . Cyanocobalamin (VITAMIN B-12 PO) Take by mouth.    . famotidine (PEPCID) 40 MG tablet Please specify directions, refills and quantity 30 tablet 0  . fluticasone (FLONASE) 50 MCG/ACT nasal spray Use one to two sprays in each nostril once daily as directed. 16 g 5  . furosemide (LASIX) 20 MG tablet     . minocycline (DYNACIN) 100 MG tablet Take 100 mg by mouth 2 (two) times daily.    . montelukast (SINGULAIR) 5 MG chewable tablet CHEW 2 TABLETS (10 MG TOTAL) BY MOUTH AT BEDTIME. 60 tablet 1  . Multiple Vitamin (MULTIVITAMIN WITH MINERALS) TABS tablet Take 1 tablet by mouth daily.    Marland Kitchen omeprazole  (PRILOSEC) 40 MG capsule TAKE ONE CAPSULE TWICE DAILY AS DIRECTED 60 capsule 5  . PULMICORT FLEXHALER 180 MCG/ACT inhaler INHALE TWO DOSES TWICE DAILY DURING ASTHMA FLARE-UP AS DIRECTED. RINSE, GARGLE, AND SPIT AFTER USE. 1 each 0   No current facility-administered medications on file prior to visit.     Allergies  Allergen Reactions  . Amoxicillin-Pot Clavulanate Other (See Comments)    Hurts stomach  . Other Other (See Comments)    Synthetic pain medication makes her heart race ?name  . Prednisone Other (See Comments)    tablet form, upset stomach, states it makes her crazy    Objective:  General: Alert and oriented x3 in no acute distress  Dermatology: Erythema to anterior shins consistent with venous stasis that improves with elevation.  No open lesions bilateral lower extremities, no webspace macerations, no ecchymosis bilateral, all nails x 10 are well manicured.  Vascular: Dorsalis Pedis and Posterior Tibial pedal pulses faintly palpable, Capillary Fill Time 3 seconds, decreased pedal hair growth bilateral, 1+ pitting edema bilateral lower extremities, Temperature gradient mildly increased due to swelling in both legs.  Neurology: Johney Maine sensation intact via light touch bilateral.  Musculoskeletal: Mild tenderness with palpation at both lower extremities diffusely all the way to the level of the tibial tuberosity to ankle. Range of motion within normal limits with mild guarding on bilateral ankles. Strength within normal limits in all groups bilateral.   Gait: Antalgic gait  Xrays  Right and left foot and  ankle   Impression: Mild diffuse arthritis right ankle greater than left, swelling bilateral, no other acute findings.  Assessment and Plan: Problem List Items Addressed This Visit    None    Visit Diagnoses    Pain in joint involving ankle and foot, unspecified laterality    -  Primary   PVD (peripheral vascular disease) ([place])       Relevant Medications   furosemide  (LASIX) 20 MG tablet   Venous stasis dermatitis of both lower extremities       Acute bilateral ankle pain          -Complete examination performed -Xrays reviewed -Discussed treatment options for PVD that can be causing pain at her joint due to the extra accumulation of fluid -Rx venous reflux studies.  Advised patient if this is positive she will have to follow-up with vein vascular specialist however if this is negative we will refer to cardiology and PCP for nephrology work-up -Advised patient to continue with rest elevation and compression -Advised patient to continue with Lasix as prescribed by her primary care doctor and to take as ordered to prevent continued accumulation of swelling in legs -Patient to return to office as needed or sooner if condition worsens.  Landis Martins, DPM

## 2018-09-20 ENCOUNTER — Telehealth (HOSPITAL_COMMUNITY): Payer: Self-pay | Admitting: *Deleted

## 2018-09-20 ENCOUNTER — Telehealth: Payer: Self-pay | Admitting: *Deleted

## 2018-09-20 DIAGNOSIS — M25579 Pain in unspecified ankle and joints of unspecified foot: Secondary | ICD-10-CM

## 2018-09-20 DIAGNOSIS — I872 Venous insufficiency (chronic) (peripheral): Secondary | ICD-10-CM

## 2018-09-20 DIAGNOSIS — I739 Peripheral vascular disease, unspecified: Secondary | ICD-10-CM

## 2018-09-20 NOTE — Telephone Encounter (Signed)
-----   Message from Landis Martins, [place] sent at 09/19/2018  7:25 PM EDT ----- Regarding: Venous Reflux studies Bilateral swelling with early venous skin changes. Eval for reflux

## 2018-09-20 NOTE — Telephone Encounter (Signed)
[facility] - [facility] Moores states they are currently only doing Venous doppler for DVT.

## 2018-09-20 NOTE — Telephone Encounter (Signed)
Orders to [facility] on required form, demographics and clinicals included.

## 2018-09-20 NOTE — Telephone Encounter (Signed)
Left voicemail asking for return call to schedule vascular ultrasound

## 2018-09-20 NOTE — Telephone Encounter (Signed)
I informed pt of [facility] not performing venous reflux testing and offered [facility] in [place]. Pt accepted, and I informed [facility] [address], [place]. Faxed required orders, clinicals and demographics to [facility].

## 2018-09-21 ENCOUNTER — Ambulatory Visit (HOSPITAL_COMMUNITY)
Admission: RE | Admit: 2018-09-21 | Discharge: 2018-09-21 | Disposition: A | Discharge status: Home / Self Care | Payer: BC Managed Care – PPO | Source: Ambulatory Visit | Attending: Family | Admitting: Family

## 2018-09-21 ENCOUNTER — Other Ambulatory Visit: Payer: Self-pay

## 2018-09-21 DIAGNOSIS — I739 Peripheral vascular disease, unspecified: Secondary | ICD-10-CM | POA: Insufficient documentation

## 2018-09-21 DIAGNOSIS — I872 Venous insufficiency (chronic) (peripheral): Secondary | ICD-10-CM | POA: Diagnosis not present

## 2018-09-24 ENCOUNTER — Telehealth: Payer: Self-pay | Admitting: *Deleted

## 2018-09-24 DIAGNOSIS — I739 Peripheral vascular disease, unspecified: Secondary | ICD-10-CM

## 2018-09-24 DIAGNOSIS — I872 Venous insufficiency (chronic) (peripheral): Secondary | ICD-10-CM

## 2018-09-24 NOTE — Telephone Encounter (Signed)
-----   Message from Landis Martins, [place] sent at 09/21/2018  7:59 PM EDT ----- Will you let patient know that her Venous study showed that there is reflux abnormal backflow or congestion in her veins. We need to refer her to a vein/vascular specialist for further care. -Dr. Cannon Kettle

## 2018-09-24 NOTE — Telephone Encounter (Signed)
[facility] states they do take care of venous reflux, fax referral to [phone]. Faxed referral, clinicals and demographics to [facility]. I informed pt [facility] was on [address] in [place], [phone].

## 2018-11-14 ENCOUNTER — Other Ambulatory Visit: Payer: Self-pay | Admitting: Allergy and Immunology

## 2018-12-18 ENCOUNTER — Other Ambulatory Visit: Payer: Self-pay | Admitting: Allergy and Immunology

## 2019-01-04 HISTORY — PX: ABLATION SAPHENOUS VEIN W/ RFA: SUR11

## 2019-03-07 ENCOUNTER — Other Ambulatory Visit: Payer: Self-pay | Admitting: Allergy and Immunology

## 2019-03-07 NOTE — Telephone Encounter (Signed)
Courtesy refill needs OV for further refills

## 2019-03-20 ENCOUNTER — Other Ambulatory Visit: Payer: Self-pay

## 2019-03-20 ENCOUNTER — Telehealth: Payer: Self-pay

## 2019-03-20 NOTE — Telephone Encounter (Signed)
C/o heavy vaginal discharge since yesterday and pain in her adb and back. She said she has this kind of pain with yeast infection and was asking about Rx. I called her back and recommended office visit as these are not classic sx of yeast and best to see her to get good diagnosis and treat.  Appt scheduled.

## 2019-03-21 ENCOUNTER — Encounter: Payer: Self-pay | Admitting: Obstetrics & Gynecology

## 2019-03-21 ENCOUNTER — Ambulatory Visit: Payer: BC Managed Care – PPO | Admitting: Obstetrics & Gynecology

## 2019-03-21 VITALS — BP 124/78

## 2019-03-21 DIAGNOSIS — N898 Other specified noninflammatory disorders of vagina: Secondary | ICD-10-CM | POA: Diagnosis not present

## 2019-03-21 DIAGNOSIS — R350 Frequency of micturition: Secondary | ICD-10-CM | POA: Diagnosis not present

## 2019-03-21 DIAGNOSIS — M545 Low back pain, unspecified: Secondary | ICD-10-CM

## 2019-03-21 LAB — URINALYSIS, COMPLETE W/RFL CULTURE
Bacteria, UA: NONE SEEN /HPF
Bilirubin Urine: NEGATIVE
Glucose, UA: NEGATIVE
Hgb urine dipstick: NEGATIVE
Hyaline Cast: NONE SEEN /LPF
Ketones, ur: NEGATIVE
Leukocyte Esterase: NEGATIVE
Nitrites, Initial: NEGATIVE
Protein, ur: NEGATIVE
RBC / HPF: NONE SEEN /HPF (ref 0–2)
Specific Gravity, Urine: 1.02 (ref 1.001–1.03)
WBC, UA: NONE SEEN /HPF (ref 0–5)
pH: 6 (ref 5.0–8.0)

## 2019-03-21 LAB — NO CULTURE INDICATED

## 2019-03-21 LAB — WET PREP FOR TRICH, YEAST, CLUE

## 2019-03-21 NOTE — Progress Notes (Signed)
    Ashley Eaton [birth year] [id number]        58 y.o.  [id number]   RP: Vaginal discharge with lower back and abdominal pain  HPI: Vaginal discharge with lower back and abdominal pain.  No vaginal odor and no itching.  Postmenopausal, well on no hormone replacement therapy.  No postmenopausal bleeding.  Urine with frequency but no other symptoms of infection.  No fever.    OB History  Gravida Para Term Preterm AB Living  6 3     2 3   SAB TAB Ectopic Multiple Live Births  1            # Outcome Date GA Lbr Len/2nd Weight Sex Delivery Anes PTL Lv  6 Gravida           5 AB           4 SAB           3 Para           2 Para           1 Para             Past medical history,surgical history, problem list, medications, allergies, family history and social history were all reviewed and documented in the EPIC chart.   Directed ROS with pertinent positives and negatives documented in the history of present illness/assessment and plan.  Exam:  Vitals:   03/21/19 1406  BP: 124/78   General appearance:  Normal  Abdomen: Normal  Gynecologic exam: Vulva normal.  Speculum:  Cervix/Vagina normal.  Secretions wnl.  Wet prep done.  Bimanual exam:  Uterus AV, normal volume, mobile, NT.  No adnexal mass, NT bilaterally.  Wet prep: Negative U/A: Negative   Assessment/Plan:  59 y.o. [id number]   1. Vaginal discharge Wet prep negative, patient reassured.  Normal vaginal secretions on exam.  Normal gynecologic exam. - WET PREP FOR [place], YEAST, CLUE  2. Urinary frequency Urine analysis completely negative, patient reassured. - Urinalysis,Complete w/RFL Culture  3. Midline low back pain without sciatica, unspecified chronicity Precautions on posture.  Physical therapy/orthopedist if worsens.  Other orders - REFLEXIVE URINE CULTURE  Princess Bruins MD, 2:29 PM 03/21/2019

## 2019-03-29 ENCOUNTER — Encounter: Payer: Self-pay | Admitting: Obstetrics & Gynecology

## 2019-03-29 NOTE — Patient Instructions (Signed)
1. Vaginal discharge Wet prep negative, patient reassured.  Normal vaginal secretions on exam.  Normal gynecologic exam. - WET PREP FOR [place], YEAST, CLUE  2. Urinary frequency Urine analysis completely negative, patient reassured. - Urinalysis,Complete w/RFL Culture  3. Midline low back pain without sciatica, unspecified chronicity Precautions on posture.  Physical therapy/orthopedist if worsens.  Other orders - REFLEXIVE URINE CULTURE  Ashley Eaton, it was a pleasure seeing you today!

## 2019-04-12 ENCOUNTER — Other Ambulatory Visit: Payer: Self-pay | Admitting: Allergy and Immunology

## 2019-06-28 ENCOUNTER — Encounter: Payer: BC Managed Care – PPO | Admitting: Obstetrics & Gynecology

## 2019-06-28 ENCOUNTER — Other Ambulatory Visit: Payer: Self-pay

## 2019-07-01 ENCOUNTER — Ambulatory Visit (INDEPENDENT_AMBULATORY_CARE_PROVIDER_SITE_OTHER): Payer: BC Managed Care – PPO | Admitting: Obstetrics & Gynecology

## 2019-07-01 ENCOUNTER — Other Ambulatory Visit: Payer: Self-pay

## 2019-07-01 ENCOUNTER — Encounter: Payer: Self-pay | Admitting: Obstetrics & Gynecology

## 2019-07-01 VITALS — BP 128/84 | Ht 61.5 in | Wt 176.0 lb

## 2019-07-01 DIAGNOSIS — Z780 Asymptomatic menopausal state: Secondary | ICD-10-CM

## 2019-07-01 DIAGNOSIS — Z6832 Body mass index (BMI) 32.0-32.9, adult: Secondary | ICD-10-CM | POA: Diagnosis not present

## 2019-07-01 DIAGNOSIS — E6609 Other obesity due to excess calories: Secondary | ICD-10-CM

## 2019-07-01 DIAGNOSIS — Z01419 Encounter for gynecological examination (general) (routine) without abnormal findings: Secondary | ICD-10-CM | POA: Diagnosis not present

## 2019-07-01 NOTE — Progress Notes (Signed)
Ashley Eaton May 04, 1960 [id number]   History:    59 y.o.  [id number]  Married.  Ashley Eaton, husband with DM.  Ashley Eaton is pregnant.  Ashley Eaton is 59 yo.  RP:  Established patient presenting for annual gyn exam   HPI: Post menopause, well on no hormone replacement therapy.  No postmenopausal bleeding.  No pelvic pain.  No pain with intercourse.  Urine and bowel movements normal.  Breast normal.  Body mass index 32.72.  Healthy nutrition with vegetables.  Physically active with yard work especially.  Health labs with family physician.   Past medical history,surgical history, family history and social history were all reviewed and documented in the EPIC chart.  Gynecologic History No LMP recorded. Patient is postmenopausal.  Obstetric History OB History  Gravida Para Term Preterm AB Living  6 3     2 3   SAB TAB Ectopic Multiple Live Births  1            # Outcome Date GA Lbr Len/2nd Weight Sex Delivery Anes PTL Lv  6 Gravida           5 AB           4 SAB           3 Para           2 Para           1 Para              ROS: A ROS was performed and pertinent positives and negatives are included in the history.  GENERAL: No fevers or chills. HEENT: No change in vision, no earache, sore throat or sinus congestion. NECK: No pain or stiffness. CARDIOVASCULAR: No chest pain or pressure. No palpitations. PULMONARY: No shortness of breath, cough or wheeze. GASTROINTESTINAL: No abdominal pain, nausea, vomiting or diarrhea, melena or bright red blood per rectum. GENITOURINARY: No urinary frequency, urgency, hesitancy or dysuria. MUSCULOSKELETAL: No joint or muscle pain, no back pain, no recent trauma. DERMATOLOGIC: No rash, no itching, no lesions. ENDOCRINE: No polyuria, polydipsia, no heat or cold intolerance. No recent change in weight. HEMATOLOGICAL: No anemia or easy bruising or bleeding. NEUROLOGIC: No headache, seizures, numbness, tingling or weakness. PSYCHIATRIC: No depression, no loss of  interest in normal activity or change in sleep pattern.     Exam:   BP 128/84   Ht 5' 1.5" (1.562 m)   Wt 176 lb (79.8 kg)   BMI 32.72 kg/m   Body mass index is 32.72 kg/m.  General appearance : Well developed well nourished female. No acute distress HEENT: Eyes: no retinal hemorrhage or exudates,  Neck supple, trachea midline, no carotid bruits, no thyroidmegaly Lungs: Clear to auscultation, no rhonchi or wheezes, or rib retractions  Heart: Regular rate and rhythm, no murmurs or gallops Breast:Examined in sitting and supine position were symmetrical in appearance, no palpable masses or tenderness,  no skin retraction, no nipple inversion, no nipple discharge, no skin discoloration, no axillary or supraclavicular lymphadenopathy Abdomen: no palpable masses or tenderness, no rebound or guarding Extremities: no edema or skin discoloration or tenderness  Pelvic: Vulva: Normal             Vagina: No gross lesions or discharge  Cervix: No gross lesions or discharge  Uterus  AV, normal size, shape and consistency, non-tender and mobile  Adnexa  Without masses or tenderness  Anus: Normal   Assessment/Plan:  59 y.o. female for annual exam   1.  Well female exam with routine gynecological exam Normal gynecologic exam in menopause.  No indication to repeat a Pap test this year.  Breast exam normal.  Screening mammogram July 2020 was negative, will repeat now.  Colonoscopy 2016.  Health labs with family physician.  2. Postmenopause Well on no hormone replacement therapy.  No postmenopausal bleeding.  3. Class 1 obesity due to excess calories without serious comorbidity with body mass index (BMI) of 32.0 to 32.9 in adult Recommend a lower calorie/carb diet such as [employer].  Aerobic activities 5 times a week and light weightlifting every 2 days.  Ashley Bruins MD, 2:42 PM 07/01/2019

## 2019-07-05 ENCOUNTER — Encounter: Payer: Self-pay | Admitting: Obstetrics & Gynecology

## 2019-07-05 NOTE — Patient Instructions (Signed)
1. Well female exam with routine gynecological exam Normal gynecologic exam in menopause.  No indication to repeat a Pap test this year.  Breast exam normal.  Screening mammogram July 2020 was negative, will repeat now.  Colonoscopy 2016.  Health labs with family physician.  2. Postmenopause Well on no hormone replacement therapy.  No postmenopausal bleeding.  3. Class 1 obesity due to excess calories without serious comorbidity with body mass index (BMI) of 32.0 to 32.9 in adult Recommend a lower calorie/carb diet such as [employer].  Aerobic activities 5 times a week and light weightlifting every 2 days.  Carolynne, it was a pleasure seeing you today!

## 2019-07-26 ENCOUNTER — Other Ambulatory Visit: Payer: Self-pay | Admitting: Allergy and Immunology

## 2019-08-07 ENCOUNTER — Encounter: Payer: Self-pay | Admitting: Obstetrics & Gynecology

## 2019-10-28 ENCOUNTER — Other Ambulatory Visit: Payer: Self-pay | Admitting: Allergy and Immunology

## 2019-10-29 ENCOUNTER — Telehealth: Payer: Self-pay | Admitting: *Deleted

## 2019-10-29 NOTE — Telephone Encounter (Signed)
Yes agree with Diflucan prescription.

## 2019-10-29 NOTE — Telephone Encounter (Signed)
Patient called c/o yeast infection itching and white discharge, currently on antibiotic for sinus infection. Patient asked if diflucan can be sent to pharmacy? Please advise

## 2019-10-30 ENCOUNTER — Other Ambulatory Visit: Payer: Self-pay

## 2019-10-30 ENCOUNTER — Ambulatory Visit (INDEPENDENT_AMBULATORY_CARE_PROVIDER_SITE_OTHER): Payer: BC Managed Care – PPO | Admitting: Allergy and Immunology

## 2019-10-30 ENCOUNTER — Encounter: Payer: Self-pay | Admitting: Allergy and Immunology

## 2019-10-30 VITALS — BP 122/88 | HR 82 | Resp 18

## 2019-10-30 DIAGNOSIS — H6982 Other specified disorders of Eustachian tube, left ear: Secondary | ICD-10-CM

## 2019-10-30 DIAGNOSIS — J3089 Other allergic rhinitis: Secondary | ICD-10-CM

## 2019-10-30 DIAGNOSIS — J4541 Moderate persistent asthma with (acute) exacerbation: Secondary | ICD-10-CM | POA: Diagnosis not present

## 2019-10-30 DIAGNOSIS — K219 Gastro-esophageal reflux disease without esophagitis: Secondary | ICD-10-CM

## 2019-10-30 MED ORDER — FLUTICASONE PROPIONATE 50 MCG/ACT NA SUSP: NASAL | 5 refills | Status: DC

## 2019-10-30 MED ORDER — FLUCONAZOLE 150 MG PO TABS: 150.0000 mg | ORAL_TABLET | Freq: Once | ORAL | 0 refills | Status: DC

## 2019-10-30 MED ORDER — BUDESONIDE-FORMOTEROL FUMARATE 160-4.5 MCG/ACT IN AERO: INHALATION_SPRAY | RESPIRATORY_TRACT | 5 refills | Status: DC

## 2019-10-30 MED ORDER — ALBUTEROL SULFATE HFA 108 (90 BASE) MCG/ACT IN AERS: INHALATION_SPRAY | RESPIRATORY_TRACT | 1 refills | Status: DC

## 2019-10-30 MED ORDER — METHYLPREDNISOLONE ACETATE 80 MG/ML IJ SUSP
80.0000 mg | Freq: Once | INTRAMUSCULAR | Status: AC
  Administered 2019-10-30: 80 mg via INTRAMUSCULAR

## 2019-10-30 MED ORDER — MONTELUKAST SODIUM 10 MG PO TABS: ORAL_TABLET | ORAL | 5 refills | Status: DC

## 2019-10-30 MED ORDER — METHYLPREDNISOLONE ACETATE 80 MG/ML IJ SUSP
80.0000 mg | Freq: Once | INTRAMUSCULAR | Status: DC
Start: 2019-10-30 — End: 2019-10-30

## 2019-10-30 MED ORDER — OMEPRAZOLE 40 MG PO CPDR: DELAYED_RELEASE_CAPSULE | ORAL | 5 refills | Status: DC

## 2019-10-30 NOTE — Patient Instructions (Addendum)
  1. Treat this inflammatory flare up:   A. Flonase 1 spray each nostril 2 times per day  B. montelukast 10 mg tablet 1 time per day  C. Symbicort 160 - 2 inhalations 2 times per day with spacer   D. Depo-Medrol 80 mg IM delivered in clinic today   2. Treat and prevent reflux with this flareup:   A.  omeprazole 40 mg 2 times a day  3.  Finish antibiotic prescribed by Dr. Helene Kelp  4. Consider obtaining Covid swab  5. If needed:      A. OTC antihistamine  B. OTC nasal saline   C. Proair HFA 2 inhalations every 4-6 hours  6. Return to clinic in 2 weeks of earlier if needed

## 2019-10-30 NOTE — Telephone Encounter (Signed)
Rx sent 

## 2019-10-30 NOTE — Progress Notes (Signed)
Ashley Eaton   Follow-up Note  Referring Provider: Ronita Hipps, MD Primary Provider: Ronita Hipps, MD Date of Office Visit: 10/30/2019  Subjective:   Ashley Eaton (DOB: 02-03-1960) is a 59 y.o. female who returns to the [facility] on 10/30/2019 in re-evaluation of the following:  HPI: Ashley Eaton presents to this clinic in evaluation of breathing problems.  I last saw her in this clinic on 25 January 2018 at which point in time she appeared to have an issue with respiratory tract inflammation and reflux.  Her dog passed away in 01/30/2019 and ever since that point in time she has really done well and has basically tapered off all of her medications revolving around respiratory tract inflammation.  She no longer uses any Symbicort or montelukast or Flonase and overall feels very good with her airway.  Rarely does she use a short acting bronchodilator and she has not required a systemic steroid or an antibiotic for an airway issue in a year.  Unfortunately, a dog was brought into the household this weekend and Ashley Eaton has developed wheezing and coughing and nasal congestion without any fever or ugly nasal discharge or ugly sputum production or chest pain or other respiratory tract symptoms for which she saw her primary care doctor 2 days ago and was given cefdinir.  She is actually worse today.  She did restart her Symbicort yesterday.  She has had no issues with reflux.  She has received 2 Covid Moderna vaccines and the flu vaccine.  Allergies as of 10/30/2019      Reactions   Amoxicillin-pot Clavulanate Other (See Comments)   Hurts stomach   Other Other (See Comments)   Synthetic pain medication makes her heart race ?name   Prednisone Other (See Comments)   tablet form, upset stomach, states it makes her crazy      Medication List    albuterol 108 (90 Base) MCG/ACT inhaler Commonly known as: VENTOLIN HFA Inhale  two puffs every 4-6 hours if needed for cough or wheeze.   budesonide-formoterol 160-4.5 MCG/ACT inhaler Commonly known as: Symbicort Inhale two puffs twice daily to prevent cough or wheeze. Rinse mouth after use.   fluticasone 50 MCG/ACT nasal spray Commonly known as: FLONASE Use one spray in each nostril twice daily as directed   montelukast 10 MG tablet Commonly known as: Singulair Take one tablet once daily as directed   omeprazole 40 MG capsule Commonly known as: PRILOSEC Take one capsule twice daily   Pulmicort Flexhaler 180 MCG/ACT inhaler Generic drug: budesonide INHALE TWO DOSES TWICE DAILY DURING ASTHMA FLARE-UP AS DIRECTED. RINSE, GARGLE, AND SPIT AFTER USE.       Past Medical History:  Diagnosis Date  . Asthma   . Cancer (HCC) 06?   vulva  . Complication of anesthesia   . LPRD (laryngopharyngeal reflux disease)   . Neuromuscular disorder ([place])    Morton's neuroma, plantar fasciitis s/p 10  . PONV (postoperative nausea and vomiting)     Past Surgical History:  Procedure Laterality Date  . ABLATION SAPHENOUS VEIN W/ RFA Left 2021  . CERVICAL CONIZATION W/BX  06  . CHOLECYSTECTOMY N/A 10/15/2013   Procedure: LAPAROSCOPIC CHOLECYSTECTOMY WITH INTRAOPERATIVE CHOLANGIOGRAM;  Surgeon: Donnie Mesa, MD;  Location: [facility];  Service: General;  Laterality: N/A;  . COLONOSCOPY  12/2014   with Dr. Collene Mares next one in 5 yrs  . DIAGNOSTIC LAPAROSCOPY     endometriosis-  59 yrs old  . FOOT SURGERY Bilateral 2010,2011   For Plantar Faciitis  . FOOT SURGERY Bilateral 1992,95   For Morton's Neuroma    Review of systems negative except as noted in HPI / PMHx or noted below:  Review of Systems  Constitutional: Negative.   HENT: Negative.   Eyes: Negative.   Respiratory: Negative.   Cardiovascular: Negative.   Gastrointestinal: Negative.   Genitourinary: Negative.   Musculoskeletal: Negative.   Skin: Negative.   Neurological: Negative.   Endo/Heme/Allergies:  Negative.   Psychiatric/Behavioral: Negative.      Objective:   Vitals:   10/30/19 1023  BP: 122/88  Pulse: 82  Resp: 18  SpO2: 98%          Physical Exam Constitutional:      Appearance: She is not diaphoretic.  HENT:     Head: Normocephalic.     Right Ear: Tympanic membrane, ear canal and external ear normal.     Left Ear: Ear canal and external ear normal. A middle ear effusion is present.     Nose: Nose normal. No mucosal edema or rhinorrhea.     Mouth/Throat:     Pharynx: Uvula midline. No oropharyngeal exudate.  Eyes:     Conjunctiva/sclera: Conjunctivae normal.  Neck:     Thyroid: No thyromegaly.     Trachea: Trachea normal. No tracheal tenderness or tracheal deviation.  Cardiovascular:     Rate and Rhythm: Normal rate and regular rhythm.     Heart sounds: Normal heart sounds, S1 normal and S2 normal. No murmur heard.   Pulmonary:     Effort: No respiratory distress.     Breath sounds: Normal breath sounds. No stridor. No wheezing or rales.  Lymphadenopathy:     Head:     Right side of head: No tonsillar adenopathy.     Left side of head: No tonsillar adenopathy.     Cervical: No cervical adenopathy.  Skin:    Findings: No erythema or rash.     Nails: There is no clubbing.  Neurological:     Mental Status: She is alert.     Diagnostics:    Spirometry was performed and demonstrated an FEV1 of 1.48 at 60 % of predicted.   Assessment and Plan:   1. Asthma, not well controlled, moderate persistent, with acute exacerbation   2. Other allergic rhinitis   3. LPRD (laryngopharyngeal reflux disease)   4. ETD (Eustachian tube dysfunction), left     1. Treat this inflammatory flare up:   A. Flonase 1 spray each nostril 2 times per day  B. montelukast 10 mg tablet 1 time per day  C. Symbicort 160 - 2 inhalations 2 times per day with spacer   D. Depo-Medrol 80 mg IM delivered in clinic today   2. Treat and prevent reflux with this flareup:   A.   omeprazole 40 mg 2 times a day  3.  Finish antibiotic prescribed by Dr. Helene Kelp  4. Consider obtaining Covid swab  5. If needed:      A. OTC antihistamine  B. OTC nasal saline   C. Proair HFA 2 inhalations every 4-6 hours  6. Return to clinic in 2 weeks of earlier if needed   Certainly Ashley Eaton has inflammation of her respiratory track although the etiologic factor responsible for this issue is not entirely clear.  It may be all secondary to exposure to dog this weekend or she may have contracted a viral respiratory tract infection.  She can obtain a Covid swab to help answer the question of whether or not this is secondary to Covid.  We will treat her with anti-inflammatory medications as noted above and she will contact me if should she have difficulty in the face of this treatment.  Allena Katz, MD [facility] [place]

## 2019-10-31 ENCOUNTER — Encounter: Payer: Self-pay | Admitting: Allergy and Immunology

## 2019-11-13 ENCOUNTER — Other Ambulatory Visit: Payer: Self-pay

## 2019-11-13 ENCOUNTER — Ambulatory Visit: Payer: BC Managed Care – PPO | Admitting: Allergy and Immunology

## 2019-11-13 ENCOUNTER — Encounter: Payer: Self-pay | Admitting: Allergy and Immunology

## 2019-11-13 VITALS — BP 120/70 | HR 80

## 2019-11-13 DIAGNOSIS — J4541 Moderate persistent asthma with (acute) exacerbation: Secondary | ICD-10-CM

## 2019-11-13 DIAGNOSIS — K219 Gastro-esophageal reflux disease without esophagitis: Secondary | ICD-10-CM

## 2019-11-13 DIAGNOSIS — J3089 Other allergic rhinitis: Secondary | ICD-10-CM

## 2019-11-13 MED ORDER — HYDROCOD POLST-CPM POLST ER 10-8 MG/5ML PO SUER: ORAL | 0 refills | Status: DC

## 2019-11-13 NOTE — Patient Instructions (Addendum)
  1.  Continue to treat this inflammatory flare up:   A. Flonase 1 spray each nostril 2 times per day  B. montelukast 10 mg tablet 1 time per day  C. Symbicort 160 - 2 inhalations 2 times per day with spacer     2.  Continue to treat and prevent reflux with this flareup:   A.  omeprazole 40 mg 2 times a day  3. If needed:      A. OTC antihistamine  B. OTC nasal saline   C. Proair HFA 2 inhalations every 4-6 hours  D. Tussionex -2.5-5.0 mL every 12 hours if needed for cough.  Narcotic warning.  60 mL  4. Call clinic in 2 weeks of earlier if needed. Taper?

## 2019-11-13 NOTE — Progress Notes (Signed)
[facility]   Follow-up Note    Referring Provider: Ronita Hipps, MD Primary Provider: Ronita Hipps, MD Date of Office Visit: 11/13/2019  Subjective:   Ashley Eaton (DOB: 26-May-1960) is a 59 y.o. female who returns to [facility] on 11/13/2019 in re-evaluation of the following:  HPI: Ashley Eaton presents to this clinic in evaluation of a respiratory tract flare addressed during her last evaluation of 30 October 2019 which appeared to be precipitated by dog exposure.  She has improved since her last evaluation.  Her coughing attacks are less frequent and less intense but she still has these issues.  Most of her nasal issue has resolved.  She has been consistently using all of her anti-inflammatory medications for her airway.  The dog has been removed from the household.  She has not been having any issues with reflux.  Allergies as of 11/13/2019      Reactions   Amoxicillin-pot Clavulanate Other (See Comments)   Hurts stomach   Other Other (See Comments)   Synthetic pain medication makes her heart race ?name   Prednisone Other (See Comments)   tablet form, upset stomach, states it makes her crazy      Medication List    albuterol 108 (90 Base) MCG/ACT inhaler Commonly known as: VENTOLIN HFA Inhale two puffs every 4-6 hours if needed for cough or wheeze.   budesonide-formoterol 160-4.5 MCG/ACT inhaler Commonly known as: Symbicort Inhale two puffs twice daily to prevent cough or wheeze. Rinse mouth after use.   chlorpheniramine-HYDROcodone 10-8 MG/5ML Suer Commonly known as: Tussionex Pennkinetic ER Take 2.5-5 ml every 12 hours if needed for cough Started by: Ruchy Wildrick Kevan Rosebush, MD   fluticasone 50 MCG/ACT nasal spray Commonly known as: FLONASE Use one spray in each nostril twice daily as directed   montelukast 10 MG tablet Commonly known as: Singulair Take one tablet once daily as directed   omeprazole  40 MG capsule Commonly known as: PRILOSEC Take one capsule twice daily       Past Medical History:  Diagnosis Date  . Asthma   . Cancer (HCC) 06?   vulva  . Complication of anesthesia   . LPRD (laryngopharyngeal reflux disease)   . Neuromuscular disorder ([place])    Morton's neuroma, plantar fasciitis s/p 10  . PONV (postoperative nausea and vomiting)     Past Surgical History:  Procedure Laterality Date  . ABLATION SAPHENOUS VEIN W/ RFA Left 2021  . CERVICAL CONIZATION W/BX  06  . CHOLECYSTECTOMY N/A 10/15/2013   Procedure: LAPAROSCOPIC CHOLECYSTECTOMY WITH INTRAOPERATIVE CHOLANGIOGRAM;  Surgeon: Donnie Mesa, MD;  Location: [facility];  Service: General;  Laterality: N/A;  . COLONOSCOPY  12/2014   with Dr. Collene Mares next one in 5 yrs  . DIAGNOSTIC LAPAROSCOPY     endometriosis- 59 yrs old  . FOOT SURGERY Bilateral 2010,2011   For Plantar Faciitis  . FOOT SURGERY Bilateral 1992,95   For Morton's Neuroma    Review of systems negative except as noted in HPI / PMHx or noted below:  Review of Systems  Constitutional: Negative.   HENT: Negative.   Eyes: Negative.   Respiratory: Negative.   Cardiovascular: Negative.   Gastrointestinal: Negative.   Genitourinary: Negative.   Musculoskeletal: Negative.   Skin: Negative.   Neurological: Negative.   Endo/Heme/Allergies: Negative.   Psychiatric/Behavioral: Negative.      Objective:   Vitals:   11/13/19 1603  BP: 120/70  Pulse: 80  SpO2: (!) 20%          Physical Exam Constitutional:      Appearance: She is not diaphoretic.  HENT:     Head: Normocephalic.     Right Ear: Tympanic membrane, ear canal and external ear normal.     Left Ear: Tympanic membrane, ear canal and external ear normal.     Nose: Nose normal. No mucosal edema or rhinorrhea.     Mouth/Throat:     Pharynx: Uvula midline. No oropharyngeal exudate.  Eyes:     Conjunctiva/sclera: Conjunctivae normal.  Neck:     Thyroid: No thyromegaly.      Trachea: Trachea normal. No tracheal tenderness or tracheal deviation.  Cardiovascular:     Rate and Rhythm: Normal rate and regular rhythm.     Heart sounds: Normal heart sounds, S1 normal and S2 normal. No murmur heard.   Pulmonary:     Effort: No respiratory distress.     Breath sounds: Normal breath sounds. No stridor. No wheezing or rales.  Lymphadenopathy:     Head:     Right side of head: No tonsillar adenopathy.     Left side of head: No tonsillar adenopathy.     Cervical: No cervical adenopathy.  Skin:    Findings: No erythema or rash.     Nails: There is no clubbing.  Neurological:     Mental Status: She is alert.     Diagnostics:    Spirometry was performed and demonstrated an FEV1 of 1.92 at 79 % of predicted.  Assessment and Plan:   1. Asthma, not well controlled, moderate persistent, with acute exacerbation   2. Other allergic rhinitis   3. LPRD (laryngopharyngeal reflux disease)     1.  Continue to treat this inflammatory flare up:   A. Flonase 1 spray each nostril 2 times per day  B. montelukast 10 mg tablet 1 time per day  C. Symbicort 160 - 2 inhalations 2 times per day with spacer     2.  Continue to treat and prevent reflux with this flareup:   A.  omeprazole 40 mg 2 times a day  3. If needed:      A. OTC antihistamine  B. OTC nasal saline   C. Proair HFA 2 inhalations every 4-6 hours  D. Tussionex -2.5-5.0 mL every 12 hours if needed for cough.  Narcotic warning.  60 mL  4. Call clinic in 2 weeks of earlier if needed. Taper?    Overall Rayn appears to be doing pretty well and we will keep her on a large collection of medical therapy directed against inflammation and reflux as noted above at least for the first see of all future.  I suspect at some point she will be able to eliminate use of several of her medications directed against inflammation and reflux.  She will contact me by telephone in 2 weeks and will make a decision about how to  proceed at that point.  Allena Katz, MD Allergy / Immunology [facility]

## 2019-11-18 ENCOUNTER — Encounter: Payer: Self-pay | Admitting: Allergy and Immunology

## 2019-11-26 ENCOUNTER — Telehealth: Payer: Self-pay | Admitting: Allergy and Immunology

## 2019-11-26 MED ORDER — PREDNISONE 10 MG PO TABS: ORAL_TABLET | ORAL | 0 refills | Status: DC

## 2019-11-26 MED ORDER — AZITHROMYCIN 500 MG PO TABS: ORAL_TABLET | ORAL | 0 refills | Status: DC

## 2019-11-26 NOTE — Telephone Encounter (Signed)
[name] called in and states she isn't feeling any better.  [name] states she thinks she is feeling worse and stated she is "struggling"  [name] states the cough syrup puts her to sleep but does not help nor stop the coughing.  [name] wants to know what she can do.  Please advise.

## 2019-11-26 NOTE — Telephone Encounter (Signed)
Patient informed.  Patient will come by office tomorrow to pick up chest x-ray order form to take to [facility].

## 2019-11-26 NOTE — Telephone Encounter (Signed)
Please have Claiborne Billings use azithromycin 500 mg - 1 tablet 1 time per day for 3 days + prednisone 10 mg - 2 tablets 1 time per day for 3 days.  Obtain a chest x-ray

## 2019-12-03 ENCOUNTER — Telehealth: Payer: Self-pay

## 2019-12-03 NOTE — Telephone Encounter (Signed)
Called patient and informed her that Dr. Neldon Mc received her Chest X-Ray and it looks ok.

## 2020-01-01 ENCOUNTER — Encounter: Payer: Self-pay | Admitting: *Deleted

## 2020-01-09 ENCOUNTER — Telehealth: Payer: Self-pay | Admitting: Allergy and Immunology

## 2020-01-09 NOTE — Telephone Encounter (Signed)
Lets have her try azithromycin 500 mg - 1 tablet 1 time per day for 3 days plus prednisone 10 mg - 1 tablet 1 time per day for 7 days

## 2020-01-09 NOTE — Telephone Encounter (Signed)
Ashley Eaton called in and stated her right ear has fluid.  Ashley Eaton states this started approximately 3 weeks ago.  Ashley Eaton stated she has been using Flonase and some left over ear drops she had and nothing is helping.  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  Ashley Eaton states her cough is good and everything is great except this ear pain with fluid.  Ashley Eaton would like to know if Dr. Kozlow would call something in for her ear.  [facility] [place].  Please advise.  

## 2020-01-10 MED ORDER — AZITHROMYCIN 500 MG PO TABS: ORAL_TABLET | ORAL | 0 refills | Status: DC

## 2020-01-10 MED ORDER — PREDNISONE 10 MG PO TABS: ORAL_TABLET | ORAL | 0 refills | Status: DC

## 2020-01-10 NOTE — Telephone Encounter (Signed)
Medications [facility] in [place] and patient was notified and verbalized understanding

## 2020-01-27 ENCOUNTER — Telehealth: Payer: Self-pay | Admitting: Allergy and Immunology

## 2020-01-27 NOTE — Telephone Encounter (Signed)
Patient was seen at [facility] over the weekend. She was having breathing problems and had bronchitis. It has now turned into pneumonia. She said Symbicort is not helping and wants to know if there is something else she can use. [facility] [place].

## 2020-01-27 NOTE — Telephone Encounter (Signed)
Dr. Kozlow, Please advise.  

## 2020-02-11 ENCOUNTER — Telehealth: Payer: Self-pay | Admitting: *Deleted

## 2020-02-11 NOTE — Telephone Encounter (Signed)
Patient called requesting diflucan tablets to help with yeast infection. Reports she had pneumonia and was on strong antibiotics now has itching, discharge and vaginal burning. Tried OTC monistat and no relief. Please advise

## 2020-02-12 MED ORDER — FLUCONAZOLE 150 MG PO TABS: 150.0000 mg | ORAL_TABLET | Freq: Once | ORAL | 0 refills | Status: AC

## 2020-02-12 NOTE — Telephone Encounter (Signed)
Patient informed. Rx sent 

## 2020-02-12 NOTE — Telephone Encounter (Signed)
Agree with Fluconazole.

## 2020-02-24 ENCOUNTER — Ambulatory Visit: Payer: BC Managed Care – PPO | Admitting: Pulmonary Disease

## 2020-02-24 ENCOUNTER — Other Ambulatory Visit: Payer: Self-pay

## 2020-02-24 ENCOUNTER — Encounter: Payer: Self-pay | Admitting: Pulmonary Disease

## 2020-02-24 VITALS — BP 126/80 | HR 67 | Temp 97.9°F | Ht 61.5 in | Wt 178.4 lb

## 2020-02-24 DIAGNOSIS — R0602 Shortness of breath: Secondary | ICD-10-CM | POA: Diagnosis not present

## 2020-02-24 DIAGNOSIS — R0982 Postnasal drip: Secondary | ICD-10-CM

## 2020-02-24 DIAGNOSIS — R059 Cough, unspecified: Secondary | ICD-10-CM

## 2020-02-24 DIAGNOSIS — J4540 Moderate persistent asthma, uncomplicated: Secondary | ICD-10-CM

## 2020-02-24 MED ORDER — IPRATROPIUM BROMIDE 0.03 % NA SOLN: 2.0000 | Freq: Two times a day (BID) | NASAL | 12 refills | Status: DC

## 2020-02-24 MED ORDER — LORATADINE 10 MG PO TABS: 10.0000 mg | ORAL_TABLET | Freq: Every day | ORAL | 11 refills | Status: DC

## 2020-02-24 NOTE — Progress Notes (Signed)
Synopsis: Referred in February 2022 by Carlyle Lipa, NP for asthma and recurrent pneumonias.   Subjective:   PATIENT ID: Ashley Eaton GENDER: female DOB: 29-Aug-1960, MRN: [id number]   HPI  Chief Complaint  Patient presents with  . Consult    Referred by PCP for asthma. Was diagnosed with asthma back in 2018. Had PNA back in 2021.    Cara Thaxton is a 60 year old woman, never smoker with asthma who is referred to pulmonary clinic for recurrent pneumonias.   She reports being diagnosed with asthma in 2018 after having a viral infection. She has cough with sputum production, post-nasal drainage and wheezing. She does wake up at night with cough. She reports the winter months due to the cold are hardest on her breathing. She also reports issues in May due to allergies.   She is followed by Dr. Neldon Mc at [name] and has been on symbicort 160-4.31mcg 2 puffs twice daily, montelukast and as needed albuterol.   Her symptoms initially start with ear congestion and pressure then move to her sinuses and then into her chest.   She does not report issues with GERD while on omeprazole.   Past Medical History:  Diagnosis Date  . Asthma   . Cancer (HCC) 06?   vulva  . Complication of anesthesia   . LPRD (laryngopharyngeal reflux disease)   . Neuromuscular disorder ([place])    Morton's neuroma, plantar fasciitis s/p 10  . PONV (postoperative nausea and vomiting)      Family History  Problem Relation Age of Onset  . Heart failure Mother   . Diabetes Mother   . Cancer - Other Father        Renal Cell  . Asthma Sister   . Cancer Sister        cervical  . Asthma Daughter   . Asthma Sister   . Cancer Sister        cervical  . Cancer Sister        cervical  . Atrial fibrillation Sister 33     Social History   Socioeconomic History  . Marital status: Married    Spouse name: Not on file  . Number of children: Not on file  . Years of education: Not on file   . Highest education level: Not on file  Occupational History  . Not on file  Tobacco Use  . Smoking status: Never Smoker  . Smokeless tobacco: Never Used  Vaping Use  . Vaping Use: Never used  Substance and Sexual Activity  . Alcohol use: Yes    Alcohol/week: 5.0 - 7.0 standard drinks    Types: 5 - 7 Glasses of wine per week  . Drug use: No  . Sexual activity: Yes    Partners: Male    Birth control/protection: I.U.D.    Comment: 1st intercourse- 25, partners- 57, married- 88 yrs  Other Topics Concern  . Not on file  Social History Narrative  . Not on file   Social Determinants of Health   Financial Resource Strain: Not on file  Food Insecurity: Not on file  Transportation Needs: Not on file  Physical Activity: Not on file  Stress: Not on file  Social Connections: Not on file  Intimate Partner Violence: Not on file     Allergies  Allergen Reactions  . Amoxicillin-Pot Clavulanate Other (See Comments)    Hurts stomach  . Other Other (See Comments)    Synthetic pain medication  makes her heart race ?name  . Prednisone Other (See Comments)    tablet form, upset stomach, states it makes her crazy     Outpatient Medications Prior to Visit  Medication Sig Dispense Refill  . albuterol (VENTOLIN HFA) 108 (90 Base) MCG/ACT inhaler Inhale two puffs every 4-6 hours if needed for cough or wheeze. 1 each 1  . budesonide-formoterol (SYMBICORT) 160-4.5 MCG/ACT inhaler Inhale two puffs twice daily to prevent cough or wheeze. Rinse mouth after use. 10.2 g 5  . fluticasone (FLONASE) 50 MCG/ACT nasal spray Use one spray in each nostril twice daily as directed 16 mL 5  . montelukast (SINGULAIR) 10 MG tablet Take one tablet once daily as directed 30 tablet 5  . omeprazole (PRILOSEC) 40 MG capsule Take one capsule twice daily 60 capsule 5  . azithromycin (ZITHROMAX) 500 MG tablet Take one tablet by mouth once daily for three days 3 tablet 0  . chlorpheniramine-HYDROcodone (TUSSIONEX  PENNKINETIC ER) 10-8 MG/5ML SUER Take 2.5-5 ml every 12 hours if needed for cough 60 mL 0  . predniSONE (DELTASONE) 10 MG tablet Take one tablet by mouth daily for 7 days 7 tablet 0   No facility-administered medications prior to visit.    Review of Systems  Constitutional: Negative for chills, fever, malaise/fatigue and weight loss.  HENT: Positive for congestion. Negative for sinus pain and sore throat.   Eyes: Negative.   Respiratory: Positive for cough, sputum production, shortness of breath and wheezing. Negative for hemoptysis.   Cardiovascular: Negative for chest pain, palpitations, orthopnea, claudication and leg swelling.  Gastrointestinal: Negative for abdominal pain, heartburn, nausea and vomiting.  Genitourinary: Negative.   Musculoskeletal: Negative for joint pain and myalgias.  Skin: Negative for rash.  Neurological: Negative for weakness.  Endo/Heme/Allergies: Negative.   Psychiatric/Behavioral: Negative.       Objective:   Vitals:   02/24/20 1133  BP: 126/80  Pulse: 67  Temp: 97.9 F (36.6 C)  TempSrc: Temporal  SpO2: 98%  Weight: 178 lb 6.4 oz (80.9 kg)  Height: 5' 1.5" (1.562 m)     Physical Exam Constitutional:      General: She is not in acute distress.    Appearance: She is obese. She is not ill-appearing.  HENT:     Head: Normocephalic and atraumatic.  Eyes:     General: No scleral icterus.    Conjunctiva/sclera: Conjunctivae normal.     Pupils: Pupils are equal, round, and reactive to light.  Cardiovascular:     Rate and Rhythm: Normal rate and regular rhythm.     Pulses: Normal pulses.     Heart sounds: Normal heart sounds. No murmur heard.   Pulmonary:     Effort: Pulmonary effort is normal.     Breath sounds: Normal breath sounds. No wheezing, rhonchi or rales.  Abdominal:     General: Bowel sounds are normal.     Palpations: Abdomen is soft.  Musculoskeletal:     Right lower leg: No edema.     Left lower leg: No edema.   Lymphadenopathy:     Cervical: No cervical adenopathy.  Skin:    General: Skin is warm and dry.  Neurological:     General: No focal deficit present.     Mental Status: She is alert.  Psychiatric:        Mood and Affect: Mood normal.        Behavior: Behavior normal.        Thought Content: Thought content normal.  Judgment: Judgment normal.    CBC    Component Value Date/Time   WBC 7.7 06/27/2017 0843   RBC 4.49 06/27/2017 0843   HGB 14.4 06/27/2017 0843   HCT 40.4 06/27/2017 0843   PLT 315 06/27/2017 0843   MCV 90.0 06/27/2017 0843   MCH 32.1 06/27/2017 0843   MCHC 35.6 06/27/2017 0843   RDW 12.4 06/27/2017 0843   LYMPHSABS 1.5 04/21/2014 1113   MONOABS 0.4 04/21/2014 1113   EOSABS 0.2 04/21/2014 1113   BASOSABS 0.0 04/21/2014 1113   Chest imaging: CXR 01/27/20 Minimal left basilra infiltrate, scar or atelectasis.   PFT: No flowsheet data found.  Assessment & Plan:   Moderate persistent asthma, unspecified whether complicated - Plan: Pulmonary Function Test, CANCELED: Pulmonary Function Test  Cough  Post-nasal drainage - Plan: ipratropium (ATROVENT) 0.03 % nasal spray  Shortness of breath - Plan: CT Chest High Resolution  Discussion: Gertrue Willette is a 60 year old woman, never smoker with asthma who is referred to pulmonary clinic for recurrent pneumonias.  It is difficult to assess that this time if she has had true infectious pneumonias or she is having exacerbations of asthma or if she has underlying bronchiectasis vs chronic bronchitis issues.   We will check a high resolution CT chest which will help evaluate for bronchiectasis, parenchymal issues and even tracheobronchomalacia.   She is to continue symbicort, montelukast and as needed albuterol. She is to start claritin 10mg  daily for her seasonal allergies. She is also to start ipratropium nasal spray 2 sprays twice daily for her post-nasal drainage.   We will get pulmonary function tests in 2  months when she returns for follow up.   Freda Jackson, MD [facility] Office: [phone]   Current Outpatient Medications:  .  albuterol (VENTOLIN HFA) 108 (90 Base) MCG/ACT inhaler, Inhale two puffs every 4-6 hours if needed for cough or wheeze., Disp: 1 each, Rfl: 1 .  budesonide-formoterol (SYMBICORT) 160-4.5 MCG/ACT inhaler, Inhale two puffs twice daily to prevent cough or wheeze. Rinse mouth after use., Disp: 10.2 g, Rfl: 5 .  fluticasone (FLONASE) 50 MCG/ACT nasal spray, Use one spray in each nostril twice daily as directed, Disp: 16 mL, Rfl: 5 .  ipratropium (ATROVENT) 0.03 % nasal spray, Place 2 sprays into both nostrils every 12 (twelve) hours., Disp: 30 mL, Rfl: 12 .  loratadine (CLARITIN) 10 MG tablet, Take 1 tablet (10 mg total) by mouth daily., Disp: 30 tablet, Rfl: 11 .  montelukast (SINGULAIR) 10 MG tablet, Take one tablet once daily as directed, Disp: 30 tablet, Rfl: 5 .  omeprazole (PRILOSEC) 40 MG capsule, Take one capsule twice daily, Disp: 60 capsule, Rfl: 5

## 2020-02-24 NOTE — Patient Instructions (Signed)
Start claritin 10mg  daily  Start ipratropium nasal spray 2 sprays in the morning and evening.   We will schedule you for pulmonary function tests at follow up visit in 2 months  We will schedule you for CT Chest scan

## 2020-03-10 ENCOUNTER — Ambulatory Visit (INDEPENDENT_AMBULATORY_CARE_PROVIDER_SITE_OTHER)
Admission: RE | Admit: 2020-03-10 | Discharge: 2020-03-10 | Disposition: A | Discharge status: Home / Self Care | Payer: BC Managed Care – PPO | Source: Ambulatory Visit | Attending: Pulmonary Disease | Admitting: Pulmonary Disease

## 2020-03-10 ENCOUNTER — Other Ambulatory Visit: Payer: Self-pay

## 2020-03-10 DIAGNOSIS — R0602 Shortness of breath: Secondary | ICD-10-CM | POA: Diagnosis not present

## 2020-03-13 ENCOUNTER — Telehealth: Payer: Self-pay | Admitting: Pulmonary Disease

## 2020-03-13 MED ORDER — DOXYCYCLINE HYCLATE 100 MG PO TABS: 100.0000 mg | ORAL_TABLET | Freq: Two times a day (BID) | ORAL | 0 refills | Status: DC

## 2020-03-13 MED ORDER — PREDNISONE 10 MG PO TABS: ORAL_TABLET | ORAL | 0 refills | Status: AC

## 2020-03-13 NOTE — Telephone Encounter (Signed)
She should go back on the omeprazole for GERD.  Her CT Chest scan is concerning for tracheobronchomalacia which can lead to chronic cough and wheezing. Unfortunately there are no great treatments for tracheobronchomalacia.  She can be started on a prednisone taper: 30mg  x 3 days 20mg  x 3 days 10mg  x 3 days  And a course of doxycycline 100mg  twice daily for 7 days.   We can add spiriva or incruse to her daily inhaler regimen if she would like to see if this helps.   We will still have her follow up with PFTs as planned.  Thanks, Wille Glaser

## 2020-03-13 NOTE — Telephone Encounter (Signed)
Called spoke with patient.  Let her know Dr. Lisabeth Pick recommendations She wants to try the prednisone and doxy first then will think about adding the inhalers.  She was also scheduled for her PFT and follow up for Dr. Erin Fulling in April. Nothing further needed at this time.

## 2020-03-13 NOTE — Telephone Encounter (Signed)
Spoke with the pt  She states having increased cough x 4 days  She states when it started it only bothered her at hs  Now she is coughing all day, and states talking triggers her cough She has had episodes of violent coughing until she starts vomiting  Cough is prod with thick, yellow sputum  She also states her back and chest feel tight, uncomfortable to wear her bra  She is wheezing some  No f/c/s, aches  Ears "feel full"  Reviewed meds and removed omeprazole since she has stopped taking this a while back She is compliant with the following meds:  Current Outpatient Medications on File Prior to Visit  Medication Sig Dispense Refill  . albuterol (VENTOLIN HFA) 108 (90 Base) MCG/ACT inhaler Inhale two puffs every 4-6 hours if needed for cough or wheeze. 1 each 1  . budesonide-formoterol (SYMBICORT) 160-4.5 MCG/ACT inhaler Inhale two puffs twice daily to prevent cough or wheeze. Rinse mouth after use. 10.2 g 5  . fluticasone (FLONASE) 50 MCG/ACT nasal spray Use one spray in each nostril twice daily as directed 16 mL 5  . ipratropium (ATROVENT) 0.03 % nasal spray Place 2 sprays into both nostrils every 12 (twelve) hours. 30 mL 12  . loratadine (CLARITIN) 10 MG tablet Take 1 tablet (10 mg total) by mouth daily. 30 tablet 11  . montelukast (SINGULAIR) 10 MG tablet Take one tablet once daily as directed 30 tablet 5   No current facility-administered medications on file prior to visit.   ALLERGIES: Allergies  Allergen Reactions  . Amoxicillin-Pot Clavulanate Other (See Comments)    Hurts stomach  . Other Other (See Comments)    Synthetic pain medication makes her heart race ?name  . Prednisone Other (See Comments)    tablet form, upset stomach, states it makes her crazy     Please advise, thanks!

## 2020-03-25 ENCOUNTER — Emergency Department (HOSPITAL_BASED_OUTPATIENT_CLINIC_OR_DEPARTMENT_OTHER): Payer: BC Managed Care – PPO

## 2020-03-25 ENCOUNTER — Emergency Department (HOSPITAL_BASED_OUTPATIENT_CLINIC_OR_DEPARTMENT_OTHER)
Admission: EM | Admit: 2020-03-25 | Discharge: 2020-03-25 | Disposition: A | Discharge status: Home / Self Care | Payer: BC Managed Care – PPO | Attending: Emergency Medicine | Admitting: Emergency Medicine

## 2020-03-25 DIAGNOSIS — Z8544 Personal history of malignant neoplasm of other female genital organs: Secondary | ICD-10-CM | POA: Diagnosis not present

## 2020-03-25 DIAGNOSIS — M25551 Pain in right hip: Secondary | ICD-10-CM

## 2020-03-25 DIAGNOSIS — W010XXA Fall on same level from slipping, tripping and stumbling without subsequent striking against object, initial encounter: Secondary | ICD-10-CM | POA: Diagnosis not present

## 2020-03-25 DIAGNOSIS — J45909 Unspecified asthma, uncomplicated: Secondary | ICD-10-CM | POA: Diagnosis not present

## 2020-03-25 DIAGNOSIS — M25561 Pain in right knee: Secondary | ICD-10-CM | POA: Diagnosis not present

## 2020-03-25 DIAGNOSIS — Y92511 Restaurant or cafe as the place of occurrence of the external cause: Secondary | ICD-10-CM | POA: Diagnosis not present

## 2020-03-25 DIAGNOSIS — Z7952 Long term (current) use of systemic steroids: Secondary | ICD-10-CM | POA: Insufficient documentation

## 2020-03-25 MED ORDER — IBUPROFEN 400 MG PO TABS
600.0000 mg | ORAL_TABLET | Freq: Once | ORAL | Status: AC
  Administered 2020-03-25: 600 mg via ORAL
  Filled 2020-03-25: qty 1

## 2020-03-25 MED ORDER — ACETAMINOPHEN 325 MG PO TABS
650.0000 mg | ORAL_TABLET | Freq: Once | ORAL | Status: AC
  Administered 2020-03-25: 650 mg via ORAL
  Filled 2020-03-25: qty 2

## 2020-03-25 NOTE — ED Triage Notes (Signed)
Pt via ems to room 02. Pt reports being at a restaurant and slipped on water. Pt reports catching fall and felt a twist in muscle of right hip and back of knee area. Denies injury to head. A&OX4. Pt has limited movement in right leg. No obvious deformity.

## 2020-03-25 NOTE — Discharge Instructions (Addendum)
Call your primary care doctor or specialist as discussed in the next 3-5 days.   Return immediately back to the ER if:  Your symptoms worsen within the next 12-24 hours. You develop new symptoms such as new fevers, persistent vomiting, new pain, shortness of breath, or new weakness or numbness, or if you have any other concerns.

## 2020-03-25 NOTE — ED Notes (Signed)
Pt via ems to room 02. Agree with triage note. Vitals taken. A&Ox4. Respirations regular/unlabored. Connected to Bp and pulse ox. Strecther low, wheels locked, call bell within reach. Family at bedside.

## 2020-03-25 NOTE — ED Notes (Signed)
Pt educated on use of crutches. Pt displayed teach back. No further concerns at this time.

## 2020-03-25 NOTE — ED Provider Notes (Addendum)
[facility] DEPART[facility] Provider Note   CSN: [id number] Arrival date & time: 03/25/20  1916     History Chief Complaint  Patient presents with  . Hip Pain    Ashley Eaton is a 60 y.o. female.  Patient presents with right lateral hip pain as well as right posterior knee pain.  She states that she was walking out of a store when she slipped on some water.  She did quite fall as she was able to grab onto something but she states that her leg twisted out and cause pain on her right lateral hip and right knee.  No other recent illnesses no fever no vomiting cough or diarrhea.  No headache no back pain.        Past Medical History:  Diagnosis Date  . Asthma   . Cancer (HCC) 06?   vulva  . Complication of anesthesia   . LPRD (laryngopharyngeal reflux disease)   . Neuromuscular disorder ([place])    Morton's neuroma, plantar fasciitis s/p 10  . PONV (postoperative nausea and vomiting)     Patient Active Problem List   Diagnosis Date Noted  . Hyperlipidemia 12/21/2012  . Chest pain 11/10/2012  . Hypokalemia 11/10/2012    Past Surgical History:  Procedure Laterality Date  . ABLATION SAPHENOUS VEIN W/ RFA Left 2021  . CERVICAL CONIZATION W/BX  06  . CHOLECYSTECTOMY N/A 10/15/2013   Procedure: LAPAROSCOPIC CHOLECYSTECTOMY WITH INTRAOPERATIVE CHOLANGIOGRAM;  Surgeon: Donnie Mesa, MD;  Location: [facility];  Service: General;  Laterality: N/A;  . COLONOSCOPY  12/2014   with Dr. Collene Mares next one in 5 yrs  . DIAGNOSTIC LAPAROSCOPY     endometriosis- 60 yrs old  . FOOT SURGERY Bilateral 2010,2011   For Plantar Faciitis  . FOOT SURGERY Bilateral 1992,95   For Morton's Neuroma     OB History    Gravida  6   Para  3   Term      Preterm      AB  2   Living  3     SAB  1   IAB      Ectopic      Multiple      Live Births              Family History  Problem Relation Age of Onset  . Heart failure Mother   . Diabetes Mother   . Cancer -  Other Father        Renal Cell  . Asthma Sister   . Cancer Sister        cervical  . Asthma Daughter   . Asthma Sister   . Cancer Sister        cervical  . Cancer Sister        cervical  . Atrial fibrillation Sister 54    Social History   Tobacco Use  . Smoking status: Never Smoker  . Smokeless tobacco: Never Used  Vaping Use  . Vaping Use: Never used  Substance Use Topics  . Alcohol use: Yes    Alcohol/week: 5.0 - 7.0 standard drinks    Types: 5 - 7 Glasses of wine per week  . Drug use: No    Home Medications Prior to Admission medications   Medication Sig Start Date End Date Taking? Authorizing Provider  albuterol (VENTOLIN HFA) 108 (90 Base) MCG/ACT inhaler Inhale two puffs every 4-6 hours if needed for cough or wheeze. 10/30/19   Kozlow, Donnamarie Poag, MD  budesonide-formoterol (SYMBICORT) 160-4.5 MCG/ACT inhaler Inhale two puffs twice daily to prevent cough or wheeze. Rinse mouth after use. 10/30/19   Kozlow, Donnamarie Poag, MD  doxycycline (VIBRA-TABS) 100 MG tablet Take 1 tablet (100 mg total) by mouth 2 (two) times daily. 03/13/20   Freddi Starr, MD  fluticasone Asencion Islam) 50 MCG/ACT nasal spray Use one spray in each nostril twice daily as directed 10/30/19   Kozlow, Donnamarie Poag, MD  ipratropium (ATROVENT) 0.03 % nasal spray Place 2 sprays into both nostrils every 12 (twelve) hours. 02/24/20   Freddi Starr, MD  loratadine (CLARITIN) 10 MG tablet Take 1 tablet (10 mg total) by mouth daily. 02/24/20   Freddi Starr, MD  montelukast (SINGULAIR) 10 MG tablet Take one tablet once daily as directed 10/30/19   Kozlow, Donnamarie Poag, MD    Allergies    Amoxicillin-pot clavulanate, Other, and Prednisone  Review of Systems   Review of Systems  Constitutional: Negative for fever.  HENT: Negative for ear pain.   Eyes: Negative for pain.  Respiratory: Negative for cough.   Cardiovascular: Negative for chest pain.  Gastrointestinal: Negative for abdominal pain.  Genitourinary: Negative  for flank pain.  Musculoskeletal: Negative for back pain.  Skin: Negative for rash.  Neurological: Negative for headaches.    Physical Exam Updated Vital Signs BP 136/85 (BP Location: Right Arm)   Pulse 66   Temp 97.8 F (36.6 C) (Oral)   Resp 16   Ht 5\' 1"  (1.549 m)   Wt 77.1 kg   SpO2 100%   BMI 32.12 kg/m   Physical Exam Constitutional:      General: She is not in acute distress.    Appearance: Normal appearance.  HENT:     Head: Normocephalic.     Nose: Nose normal.  Eyes:     Extraocular Movements: Extraocular movements intact.  Cardiovascular:     Rate and Rhythm: Normal rate.  Pulmonary:     Effort: Pulmonary effort is normal.  Musculoskeletal:     Cervical back: Normal range of motion.     Comments: Pain with palpation of the right lateral thigh and right mid posterior thigh.  Pain with attempted range of motion of the right hip and right knee.  Pain with tenderness to the right posterior knee.  No gross bony deformity noted.  Neurovascularly intact distally otherwise.  Compartments soft.  Neurological:     General: No focal deficit present.     Mental Status: She is alert. Mental status is at baseline.     ED Results / Procedures / Treatments   Labs (all labs ordered are listed, but only abnormal results are displayed) Labs Reviewed - No data to display  EKG None  Radiology DG Knee Complete 4 Views Right  Result Date: 03/25/2020 CLINICAL DATA:  Slipped and fell, pain EXAM: RIGHT KNEE - COMPLETE 4+ VIEW COMPARISON:  None. FINDINGS: Frontal, bilateral oblique, lateral views of the right knee are obtained. No fracture, subluxation, or dislocation. Joint spaces are well preserved. No joint effusion. Soft tissues are unremarkable. IMPRESSION: 1. Unremarkable right knee. Electronically Signed   By: Randa Ngo M.D.   On: 03/25/2020 20:15   DG Hip Unilat With Pelvis 2-3 Views Right  Result Date: 03/25/2020 CLINICAL DATA:  Right hip pain, slipped and fell  EXAM: DG HIP (WITH OR WITHOUT PELVIS) 2-3V RIGHT COMPARISON:  None. FINDINGS: Frontal view of the pelvis as well as frontal and frogleg lateral views of the right hip are  obtained. No fracture, subluxation, or dislocation. Joint spaces are well preserved. Sacroiliac joints are normal. IMPRESSION: 1. No acute displaced fracture. Electronically Signed   By: Randa Ngo M.D.   On: 03/25/2020 20:14    Procedures Procedures   Medications Ordered in ED Medications  acetaminophen (TYLENOL) tablet 650 mg (650 mg Oral Given 03/25/20 1946)  ibuprofen (ADVIL) tablet 600 mg (600 mg Oral Given 03/25/20 1945)    ED Course  I have reviewed the triage vital signs and the nursing notes.  Pertinent labs & imaging results that were available during my care of the patient were reviewed by me and considered in my medical decision making (see chart for details).    MDM Rules/Calculators/A&P                          X-rays unremarkable for acute fracture.  Patient given crutch training Tylenol Motrin.  She declined stronger medications.  Will be discharged home in stable condition.  Advised follow-up with her doctor within the week.  Advised immediate return for worsening pain fevers or any additional concerns.  Patient able to weight bear with medications provided.  Final Clinical Impression(s) / ED Diagnoses Final diagnoses:  Right hip pain    Rx / DC Orders ED Discharge Orders    None       Luna Fuse, MD 03/25/20 2034    Luna Fuse, MD 03/25/20 2042

## 2020-04-02 ENCOUNTER — Other Ambulatory Visit: Payer: Self-pay | Admitting: Allergy and Immunology

## 2020-04-27 ENCOUNTER — Telehealth: Payer: Self-pay | Admitting: Pulmonary Disease

## 2020-04-27 ENCOUNTER — Other Ambulatory Visit (HOSPITAL_COMMUNITY)
Admission: RE | Admit: 2020-04-27 | Discharge: 2020-04-27 | Disposition: A | Discharge status: Home / Self Care | Payer: BC Managed Care – PPO | Source: Ambulatory Visit | Attending: Pulmonary Disease | Admitting: Pulmonary Disease

## 2020-04-27 DIAGNOSIS — Z20822 Contact with and (suspected) exposure to covid-19: Secondary | ICD-10-CM | POA: Insufficient documentation

## 2020-04-27 DIAGNOSIS — Z01812 Encounter for preprocedural laboratory examination: Secondary | ICD-10-CM | POA: Insufficient documentation

## 2020-04-27 MED ORDER — BENZONATATE 200 MG PO CAPS: 200.0000 mg | ORAL_CAPSULE | Freq: Three times a day (TID) | ORAL | 1 refills | Status: DC | PRN

## 2020-04-27 NOTE — Telephone Encounter (Signed)
I have called and spoke with pt.  She stated that she went on a nature walk Friday with her class.  She stated that things started on Friday night with her cough.  She stated that this got worse over the weekend.  She is coughing and sneezing and she said that her back and chest are sore from coughing and she is not able to sleep very much.  She is coughing up yellow sputum as well.  She did have a covid test done this morning and this was for a PFT she is having on Wednesday here in the office.  She is requesting that something be called in for her. She has been using the hydrocodone cough syrup at night to help her to sleep.    JD is not listed on the scheduled today.  Will forward to DOD.   TP please advise. Thanks  Last OV--02/24/2020 Next OV--04/29/20    Allergies  Allergen Reactions  . Amoxicillin-Pot Clavulanate Other (See Comments)    Hurts stomach  . Other Other (See Comments)    Synthetic pain medication makes her heart race ?name  . Prednisone Other (See Comments)    tablet form, upset stomach, states it makes her crazy

## 2020-04-27 NOTE — Telephone Encounter (Signed)
Called spoke with patient.  Let her know Tammy's recommendations. Verified pharmacy  Order placed Patient voiced understanding Nothing further needed at this time,

## 2020-04-27 NOTE — Telephone Encounter (Signed)
Can use Delsym 2 teaspoons twice daily as needed for cough  Can call and Tessalon Perles 200 mg 1 3 times daily as needed for cough #30 with 1 refill  Please contact office for sooner follow up if symptoms do not improve or worsen or seek emergency care

## 2020-04-28 LAB — SARS CORONAVIRUS 2 (TAT 6-24 HRS): SARS Coronavirus 2: NEGATIVE

## 2020-04-29 ENCOUNTER — Other Ambulatory Visit: Payer: Self-pay

## 2020-04-29 ENCOUNTER — Encounter: Payer: Self-pay | Admitting: Pulmonary Disease

## 2020-04-29 ENCOUNTER — Ambulatory Visit (INDEPENDENT_AMBULATORY_CARE_PROVIDER_SITE_OTHER): Payer: BC Managed Care – PPO | Admitting: Pulmonary Disease

## 2020-04-29 ENCOUNTER — Ambulatory Visit: Payer: BC Managed Care – PPO | Admitting: Pulmonary Disease

## 2020-04-29 VITALS — BP 112/76 | HR 87 | Temp 97.6°F | Ht 61.0 in | Wt 179.0 lb

## 2020-04-29 DIAGNOSIS — J398 Other specified diseases of upper respiratory tract: Secondary | ICD-10-CM | POA: Diagnosis not present

## 2020-04-29 DIAGNOSIS — J4540 Moderate persistent asthma, uncomplicated: Secondary | ICD-10-CM

## 2020-04-29 MED ORDER — BREZTRI AEROSPHERE 160-9-4.8 MCG/ACT IN AERO: 2.0000 | INHALATION_SPRAY | Freq: Two times a day (BID) | RESPIRATORY_TRACT | 0 refills | Status: DC

## 2020-04-29 NOTE — Progress Notes (Signed)
Synopsis: Referred in February 2022 by Carlyle Lipa, NP for asthma and recurrent pneumonias.   Subjective:   PATIENT ID: Ashley Eaton GENDER: female DOB: 02-18-1960, MRN: [id number]  Feeling sick from allergies since last Friday. Post-tussive emesis episodes.  + ear pressure during coughing spell. No antibiotics. No prednisone. Given tessalon perles and delsym syrup.   CT with concern for tracheobronchomalacia.   HPI  Chief Complaint  Patient presents with  . Follow-up    Sore throat since Friday, productive cough with yellow mucus, allergies. Vomiting from coughing. Work excuse for 25-27.    Lynde Ludwig is a 60 year old woman, never smoker with asthma who returns to pulmonary clinic for follow up of asthma.   She had HRCT chest on 03/10/20 since last visit which showed flattening of the lower trachea on expiration which is concerning for tracheobronchomalacia.   PFTs today show mild diffusion defect.   She reports she is having trouble with her allergies at this time and is even experiencing post-tussive emesis. She has not received prednisone over the past month.   She continues on symbicort 160-4.12mcg 2 puffs twice daily and as needed albuterol. She continues to complain of dyspnea and wheezing often.   She does not report issues with GERD while on omeprazole.   Past Medical History:  Diagnosis Date  . Asthma   . Cancer (HCC) 06?   vulva  . Complication of anesthesia   . LPRD (laryngopharyngeal reflux disease)   . Neuromuscular disorder ([place])    Morton's neuroma, plantar fasciitis s/p 10  . PONV (postoperative nausea and vomiting)      Family History  Problem Relation Age of Onset  . Heart failure Mother   . Diabetes Mother   . Cancer - Other Father        Renal Cell  . Asthma Sister   . Cancer Sister        cervical  . Asthma Daughter   . Asthma Sister   . Cancer Sister        cervical  . Cancer Sister        cervical  . Atrial fibrillation Sister 27      Social History   Socioeconomic History  . Marital status: Married    Spouse name: Not on file  . Number of children: Not on file  . Years of education: Not on file  . Highest education level: Not on file  Occupational History  . Not on file  Tobacco Use  . Smoking status: Never Smoker  . Smokeless tobacco: Never Used  Vaping Use  . Vaping Use: Never used  Substance and Sexual Activity  . Alcohol use: Yes    Alcohol/week: 5.0 - 7.0 standard drinks    Types: 5 - 7 Glasses of wine per week  . Drug use: No  . Sexual activity: Yes    Partners: Male    Birth control/protection: I.U.D.    Comment: 1st intercourse- 9, partners- 73, married- 41 yrs  Other Topics Concern  . Not on file  Social History Narrative  . Not on file   Social Determinants of Health   Financial Resource Strain: Not on file  Food Insecurity: Not on file  Transportation Needs: Not on file  Physical Activity: Not on file  Stress: Not on file  Social Connections: Not on file  Intimate Partner Violence: Not on file     Allergies  Allergen Reactions  . Amoxicillin-Pot Clavulanate Other (See Comments)  Hurts stomach  . Other Other (See Comments)    Synthetic pain medication makes her heart race ?name  . Prednisone Other (See Comments)    tablet form, upset stomach, states it makes her crazy     Outpatient Medications Prior to Visit  Medication Sig Dispense Refill  . albuterol (VENTOLIN HFA) 108 (90 Base) MCG/ACT inhaler INHALE TWO PUFFS EVERY 4-6 HOURS IF NEEDED FOR COUGH OR WHEEZE. 18 g 1  . benzonatate (TESSALON) 200 MG capsule Take 1 capsule (200 mg total) by mouth 3 (three) times daily as needed for cough. 30 capsule 1  . budesonide-formoterol (SYMBICORT) 160-4.5 MCG/ACT inhaler Inhale two puffs twice daily to prevent cough or wheeze. Rinse mouth after use. 10.2 g 5  . fluticasone (FLONASE) 50 MCG/ACT nasal spray Use one spray in each nostril twice daily as directed 16 mL 5  . ipratropium  (ATROVENT) 0.03 % nasal spray Place 2 sprays into both nostrils every 12 (twelve) hours. 30 mL 12  . loratadine (CLARITIN) 10 MG tablet Take 1 tablet (10 mg total) by mouth daily. 30 tablet 11  . montelukast (SINGULAIR) 10 MG tablet Take one tablet once daily as directed 30 tablet 5  . doxycycline (VIBRA-TABS) 100 MG tablet Take 1 tablet (100 mg total) by mouth 2 (two) times daily. (Patient not taking: Reported on 04/29/2020) 14 tablet 0   No facility-administered medications prior to visit.    Review of Systems  Constitutional: Negative for chills, fever, malaise/fatigue and weight loss.  HENT: Positive for congestion. Negative for sinus pain and sore throat.   Eyes: Negative.   Respiratory: Positive for cough, sputum production, shortness of breath and wheezing. Negative for hemoptysis.   Cardiovascular: Negative for chest pain, palpitations, orthopnea, claudication and leg swelling.  Gastrointestinal: Negative for abdominal pain, heartburn, nausea and vomiting.  Genitourinary: Negative.   Musculoskeletal: Negative for joint pain and myalgias.  Skin: Negative for rash.  Neurological: Negative for weakness.  Endo/Heme/Allergies: Negative.   Psychiatric/Behavioral: Negative.       Objective:   Vitals:   04/29/20 1556  BP: 112/76  Pulse: 87  Temp: 97.6 F (36.4 C)  SpO2: 97%  Weight: 179 lb (81.2 kg)  Height: 5\' 1"  (1.549 m)     Physical Exam Constitutional:      General: She is not in acute distress.    Appearance: She is obese. She is not ill-appearing.  HENT:     Head: Normocephalic and atraumatic.  Eyes:     General: No scleral icterus.    Conjunctiva/sclera: Conjunctivae normal.     Pupils: Pupils are equal, round, and reactive to light.  Cardiovascular:     Rate and Rhythm: Normal rate and regular rhythm.     Pulses: Normal pulses.     Heart sounds: Normal heart sounds. No murmur heard.   Pulmonary:     Effort: Pulmonary effort is normal.     Breath  sounds: Normal breath sounds. No wheezing, rhonchi or rales.  Abdominal:     General: Bowel sounds are normal.     Palpations: Abdomen is soft.  Musculoskeletal:     Right lower leg: No edema.     Left lower leg: No edema.  Lymphadenopathy:     Cervical: No cervical adenopathy.  Skin:    General: Skin is warm and dry.  Neurological:     General: No focal deficit present.     Mental Status: She is alert.  Psychiatric:  Mood and Affect: Mood normal.        Behavior: Behavior normal.        Thought Content: Thought content normal.        Judgment: Judgment normal.    CBC    Component Value Date/Time   WBC 8.6 04/29/2020 1653   RBC 4.07 04/29/2020 1653   HGB 13.2 04/29/2020 1653   HCT 38.3 04/29/2020 1653   PLT 272.0 04/29/2020 1653   MCV 94.1 04/29/2020 1653   MCH 32.1 06/27/2017 0843   MCHC 34.5 04/29/2020 1653   RDW 12.4 04/29/2020 1653   LYMPHSABS 1.9 04/29/2020 1653   MONOABS 0.6 04/29/2020 1653   EOSABS 0.1 04/29/2020 1653   BASOSABS 0.1 04/29/2020 1653   Chest imaging: HRCT Chest 03/10/20 1. No evidence of fibrotic interstitial lung disease. No significant bronchiectasis. No significant air trapping on expiratory phase Imaging. 2. presence of flattening of the lower trachea on expiratory phase imaging, concerning for tracheomalacia.  CXR 01/27/20 Minimal left basilra infiltrate, scar or atelectasis.   PFT: No flowsheet data found.  Assessment & Plan:   Moderate persistent asthma, unspecified whether complicated - Plan: CBC with Differential, IgE, IgE, CBC with Differential  Tracheobronchomalacia  Discussion: Santina Trillo is a 60 year old woman, never smoker who returns to pulmonary clinic for follow up of her asthma.  She has moderate persistent asthma along with concern for tracheobronchomalacia based on HRCT chest. She has mild reduction in diffusion capacity which could be related to her asthma. Given her ongoing symptoms we will transition her  to breztri from symbicort. She is to continue montelukast and loratidine along with flonase nasal spray.   Follow up in 4 months.   Freda Jackson, MD [facility] Office: [phone]   Current Outpatient Medications:  .  albuterol (VENTOLIN HFA) 108 (90 Base) MCG/ACT inhaler, INHALE TWO PUFFS EVERY 4-6 HOURS IF NEEDED FOR COUGH OR WHEEZE., Disp: 18 g, Rfl: 1 .  benzonatate (TESSALON) 200 MG capsule, Take 1 capsule (200 mg total) by mouth 3 (three) times daily as needed for cough., Disp: 30 capsule, Rfl: 1 .  Budeson-Glycopyrrol-Formoterol (BREZTRI AEROSPHERE) 160-9-4.8 MCG/ACT AERO, Inhale 2 puffs into the lungs in the morning and at bedtime., Disp: 5.9 g, Rfl: 0 .  budesonide-formoterol (SYMBICORT) 160-4.5 MCG/ACT inhaler, Inhale two puffs twice daily to prevent cough or wheeze. Rinse mouth after use., Disp: 10.2 g, Rfl: 5 .  fluticasone (FLONASE) 50 MCG/ACT nasal spray, Use one spray in each nostril twice daily as directed, Disp: 16 mL, Rfl: 5 .  ipratropium (ATROVENT) 0.03 % nasal spray, Place 2 sprays into both nostrils every 12 (twelve) hours., Disp: 30 mL, Rfl: 12 .  loratadine (CLARITIN) 10 MG tablet, Take 1 tablet (10 mg total) by mouth daily., Disp: 30 tablet, Rfl: 11 .  montelukast (SINGULAIR) 10 MG tablet, Take one tablet once daily as directed, Disp: 30 tablet, Rfl: 5

## 2020-04-29 NOTE — Progress Notes (Signed)
Full PFT completed today ? ?

## 2020-04-29 NOTE — Patient Instructions (Addendum)
Start Breztri inhaler 2 puffs twice daily  Continue to use albuterol as needed  Stop using Symbicort inhaler while using the breztri inhaler.   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 When you get into coughing fits, relax your breathing and try to take nice slow deep breaths.

## 2020-04-30 LAB — CBC WITH DIFFERENTIAL/PLATELET
Basophils Absolute: 0.1 10*3/uL (ref 0.0–0.1)
Basophils Relative: 0.6 % (ref 0.0–3.0)
Eosinophils Absolute: 0.1 10*3/uL (ref 0.0–0.7)
Eosinophils Relative: 1.5 % (ref 0.0–5.0)
HCT: 38.3 % (ref 36.0–46.0)
Hemoglobin: 13.2 g/dL (ref 12.0–15.0)
Lymphocytes Relative: 22 % (ref 12.0–46.0)
Lymphs Abs: 1.9 10*3/uL (ref 0.7–4.0)
MCHC: 34.5 g/dL (ref 30.0–36.0)
MCV: 94.1 fl (ref 78.0–100.0)
Monocytes Absolute: 0.6 10*3/uL (ref 0.1–1.0)
Monocytes Relative: 7 % (ref 3.0–12.0)
Neutro Abs: 5.9 10*3/uL (ref 1.4–7.7)
Neutrophils Relative %: 68.9 % (ref 43.0–77.0)
Platelets: 272 10*3/uL (ref 150.0–400.0)
RBC: 4.07 Mil/uL (ref 3.87–5.11)
RDW: 12.4 % (ref 11.5–15.5)
WBC: 8.6 10*3/uL (ref 4.0–10.5)

## 2020-04-30 LAB — IGE: IgE (Immunoglobulin E), Serum: 14 kU/L (ref <=114)

## 2020-05-25 ENCOUNTER — Other Ambulatory Visit: Payer: Self-pay | Admitting: Pulmonary Disease

## 2020-05-26 LAB — PULMONARY FUNCTION TEST
DL/VA % pred: 110 %
DL/VA: 4.79 ml/min/mmHg/L
DLCO cor % pred: 65 %
DLCO cor: 12.3 ml/min/mmHg
DLCO unc % pred: 65 %
DLCO unc: 12.3 ml/min/mmHg
FEF 25-75 Post: 2.78 L/sec
FEF 25-75 Pre: 1.83 L/sec
FEF2575-%Change-Post: 52 %
FEF2575-%Pred-Post: 119 %
FEF2575-%Pred-Pre: 78 %
FEV1-%Change-Post: 10 %
FEV1-%Pred-Post: 84 %
FEV1-%Pred-Pre: 76 %
FEV1-Post: 2.04 L
FEV1-Pre: 1.85 L
FEV1FVC-%Change-Post: 2 %
FEV1FVC-%Pred-Pre: 104 %
FEV6-%Change-Post: 6 %
FEV6-%Pred-Post: 80 %
FEV6-%Pred-Pre: 75 %
FEV6-Post: 2.41 L
FEV6-Pre: 2.26 L
FEV6FVC-%Change-Post: 0 %
FEV6FVC-%Pred-Post: 102 %
FEV6FVC-%Pred-Pre: 103 %
FVC-%Change-Post: 7 %
FVC-%Pred-Post: 78 %
FVC-%Pred-Pre: 73 %
FVC-Post: 2.42 L
Post FEV1/FVC ratio: 84 %
Post FEV6/FVC ratio: 100 %
Pre FEV1/FVC ratio: 82 %
Pre FEV6/FVC Ratio: 100 %
RV % pred: 133 %
RV: 2.41 L
TLC % pred: 96 %
TLC: 4.5 L

## 2020-05-27 ENCOUNTER — Telehealth: Payer: Self-pay | Admitting: Pulmonary Disease

## 2020-05-27 NOTE — Telephone Encounter (Signed)
Attempted to call patient. Left voicemail for patient to return call to verify directions.

## 2020-05-27 NOTE — Telephone Encounter (Signed)
Spoke with patient regarding refills for Albuterol, Singulair and Flonase. Patient states her prescriptions were last filled by Dr. Neldon Mc. Patient states she has not seen him in a 1 1/2 years. She states she is currently out of Singulair and Flonase nasal spray. She does have the Albuterol on hand.   albuterol (VENTOLIN HFA) 108 (90 base) MCG/ACT Inhale 2 puffs every 4-6 hours if needed for cough or wheezing   fluticasone (FLONASE) 50 MCG/ACT nasal spray Use one spray in each nostril twice daily as directed  montelukast (SINGULAIR) 10 MG   Take 1 tablet once daily as directed  Patient also states she was prescribed Breztri on 4/27, it is not helping her. States she can still hear the crackling when she coughs. She states that if she needs to stop the [facility] she still has Symbicort on hand.   Dr. Erin Fulling please advise.  Thank you

## 2020-05-28 ENCOUNTER — Encounter: Payer: Self-pay | Admitting: Allergy and Immunology

## 2020-05-28 ENCOUNTER — Other Ambulatory Visit: Payer: Self-pay

## 2020-05-28 ENCOUNTER — Ambulatory Visit: Payer: BC Managed Care – PPO | Admitting: Allergy and Immunology

## 2020-05-28 VITALS — BP 118/72 | HR 96 | Resp 16

## 2020-05-28 DIAGNOSIS — J4550 Severe persistent asthma, uncomplicated: Secondary | ICD-10-CM

## 2020-05-28 DIAGNOSIS — J3089 Other allergic rhinitis: Secondary | ICD-10-CM

## 2020-05-28 DIAGNOSIS — K219 Gastro-esophageal reflux disease without esophagitis: Secondary | ICD-10-CM | POA: Diagnosis not present

## 2020-05-28 MED ORDER — MONTELUKAST SODIUM 10 MG PO TABS: ORAL_TABLET | ORAL | 5 refills | Status: DC

## 2020-05-28 MED ORDER — TEZEPELUMAB-EKKO 210 MG/1.91ML ~~LOC~~ SOSY
210.0000 mg | PREFILLED_SYRINGE | SUBCUTANEOUS | Status: AC
  Administered 2020-05-28 – 2024-02-07 (×49): 210 mg via SUBCUTANEOUS

## 2020-05-28 MED ORDER — BREZTRI AEROSPHERE 160-9-4.8 MCG/ACT IN AERO: INHALATION_SPRAY | RESPIRATORY_TRACT | 5 refills | Status: DC

## 2020-05-28 MED ORDER — FLUTICASONE PROPIONATE 50 MCG/ACT NA SUSP: NASAL | 5 refills | Status: DC

## 2020-05-28 MED ORDER — ALBUTEROL SULFATE HFA 108 (90 BASE) MCG/ACT IN AERS: INHALATION_SPRAY | RESPIRATORY_TRACT | 1 refills | Status: DC

## 2020-05-28 NOTE — Progress Notes (Signed)
Immunotherapy   Patient Details  Name: Ashley Eaton MRN: [id number] Date of Birth: 1960/10/09  05/28/2020  Ashley Eaton received sample of Tezspire injection today. She will be receiving injection every 4 weeks. Consent signed and patient instructions given. Patient waited 15 minutes without any reaction.    Guy Franco 05/28/2020, 3:26 PM

## 2020-05-28 NOTE — Patient Instructions (Addendum)
  1.  Continue to treat this inflammatory flare up:   A. Flonase 1 spray each nostril 2 times per day  B. montelukast 10 mg tablet 1 time per day  C. Breztri - 2 inhalations 2 times per day with spacer   D. Tezepelumab injection today and every 4 weeks  E. Prednisone 10 mg - 1 tablet 1 time per day for 10 days only   2.  Continue to treat and prevent reflux:   A.  omeprazole 40 mg 2 times a day  3. If needed:      A. OTC antihistamine  B. OTC nasal saline   C. Proair HFA 2 inhalations every 4-6 hours  D. Mucinex DM - 2 tablets 2 times per day  4. Return to clinic in 4 weeks or earlier if problem

## 2020-05-28 NOTE — Progress Notes (Signed)
[place] [facility] [place] [facility] [place]   Follow-up Note  Referring Provider: Ronita Hipps, MD Primary Provider: Ronita Hipps, MD Date of Office Visit: 05/28/2020  Subjective:   Ashley Eaton (DOB: 11/10/1960) is a 60 y.o. female who returns to the [facility] on 05/28/2020 in re-evaluation of the following:  HPI: Giana returns to this clinic in evaluation of recurrent episodes of respiratory tract inflammation and a history of reflux induced respiratory disease.  Her last visit to this clinic was 13 November 2019.  Once again, while using a large collection of medical therapy directed against both respiratory tract inflammation and reflux, she has developed another one of her coughing episodes.  These are unrelenting episodes that she cannot control.  This episode started 5 days ago without any obvious trigger.  She has no associated upper airway symptoms.  She has not been having any obvious posttussive emesis.  She has visited with Dr. 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  She is now using a triple inhaler as her controller agent.  Allergies as of 05/28/2020      Reactions   Amoxicillin-pot Clavulanate Other (See Comments)   Hurts stomach   Other Other (See Comments)   Synthetic pain medication makes her heart race ?name   Prednisone Other (See Comments)   tablet form, upset stomach, states it makes her crazy      Medication List    albuterol 108 (90 Base) MCG/ACT inhaler Commonly known as: VENTOLIN HFA INHALE TWO PUFFS EVERY 4-6 HOURS IF NEEDED FOR COUGH OR WHEEZE.   Breztri Aerosphere 160-9-4.8 MCG/ACT Aero Generic drug: Budeson-Glycopyrrol-Formoterol Inhale 2 puffs into the lungs in the morning and at bedtime.   fluticasone 50 MCG/ACT nasal spray Commonly known as: FLONASE Use one spray in each nostril twice daily as directed    ipratropium 0.03 % nasal spray Commonly known as: ATROVENT Place 2 sprays into both nostrils every 12 (twelve) hours.   loratadine 10 MG tablet Commonly known as: CLARITIN Take 1 tablet (10 mg total) by mouth daily.   montelukast 10 MG tablet Commonly known as: Singulair Take one tablet once daily as directed       Past Medical History:  Diagnosis Date  . Asthma   . Cancer (HCC) 06?   vulva  . Complication of anesthesia   . LPRD (laryngopharyngeal reflux disease)   . Neuromuscular disorder ([place])    Morton's neuroma, plantar fasciitis s/p 10  . PONV (postoperative nausea and vomiting)     Past Surgical History:  Procedure Laterality Date  . ABLATION SAPHENOUS VEIN W/ RFA Left 2021  . CERVICAL CONIZATION W/BX  06  . CHOLECYSTECTOMY N/A 10/15/2013   Procedure: LAPAROSCOPIC CHOLECYSTECTOMY WITH INTRAOPERATIVE CHOLANGIOGRAM;  Surgeon: Donnie Mesa, MD;  Location: [facility];  Service: General;  Laterality: N/A;  . COLONOSCOPY  12/2014   with Dr. Collene Mares next one in 5 yrs  . DIAGNOSTIC LAPAROSCOPY     endometriosis- 60 yrs old  . FOOT SURGERY Bilateral 2010,2011   For Plantar Faciitis  . FOOT SURGERY Bilateral 1992,95   For Morton's Neuroma    Review of systems negative except as noted in HPI / PMHx or noted below:  Review of Systems  Constitutional: Negative.   HENT: Negative.   Eyes: Negative.   Respiratory: Negative.   Cardiovascular: Negative.   Gastrointestinal: Negative.   Genitourinary: Negative.  Musculoskeletal: Negative.   Skin: Negative.   Neurological: Negative.   Endo/Heme/Allergies: Negative.   Psychiatric/Behavioral: Negative.      Objective:   Vitals:   05/28/20 1440  BP: 118/72  Pulse: 96  Resp: 16  SpO2: 98%          Physical Exam Constitutional:      Appearance: She is not diaphoretic.     Comments: Coughing, gagging  HENT:     Head: Normocephalic.     Right Ear: Tympanic membrane, ear canal and external ear normal.     Left  Ear: Tympanic membrane, ear canal and external ear normal.     Nose: Nose normal. No mucosal edema or rhinorrhea.     Mouth/Throat:     Pharynx: Uvula midline. No oropharyngeal exudate.  Eyes:     Conjunctiva/sclera: Conjunctivae normal.  Neck:     Thyroid: No thyromegaly.     Trachea: Trachea normal. No tracheal tenderness or tracheal deviation.  Cardiovascular:     Rate and Rhythm: Normal rate and regular rhythm.     Heart sounds: Normal heart sounds, S1 normal and S2 normal. No murmur heard.   Pulmonary:     Effort: No respiratory distress.     Breath sounds: Normal breath sounds. No stridor. No wheezing (Bilateral end expiratory wheezing posterior lung fields) or rales.  Lymphadenopathy:     Head:     Right side of head: No tonsillar adenopathy.     Left side of head: No tonsillar adenopathy.     Cervical: No cervical adenopathy.  Skin:    Findings: No erythema or rash.     Nails: There is no clubbing.  Neurological:     Mental Status: She is alert.     Diagnostics:    Spirometry was performed and demonstrated an FEV1 of 1.50 at 65 % of predicted.  Results of a high-resolution chest CT scan obtained on 10 March 2020 identified the following:  Cardiovascular: Normal heart size. Scattered left coronary artery calcifications. No pericardial effusion. Mediastinum/Nodes: No enlarged mediastinal, hilar, or axillary lymph nodes. Thyroid gland, trachea, and esophagus demonstrate no significant findings. Lungs/Pleura: No evidence of fibrotic interstitial lung disease. No significant air trapping on expiratory phase imaging. No pleural effusion or pneumothorax. 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  Results of blood tests obtained 29 April 2020 identified WBC 8.6, absolute eosinophil 100, absolute lymphocyte 1900, hemoglobin 13.2, platelet 272, IgE 14 KU/L  Results of pulmonary  function tests obtained 29 April 2020 identified TLC 96%, RV 133%, DL/VA 110%  Assessment and Plan:   1. Not well controlled severe persistent asthma   2. Other allergic rhinitis   3. LPRD (laryngopharyngeal reflux disease)     1.  Continue to treat this inflammatory flare up:   A. Flonase 1 spray each nostril 2 times per day  B. montelukast 10 mg tablet 1 time per day  C. Breztri - 2 inhalations 2 times per day with spacer   D. Tezepelumab injection today and every 4 weeks  E. Prednisone 10 mg - 1 tablet 1 time per day for 10 days only   2.  Continue to treat and prevent reflux:   A.  omeprazole 40 mg 2 times a day  3. If needed:      A. OTC antihistamine  B. OTC nasal saline   C. Proair HFA 2 inhalations every 4-6 hours  D.  Mucinex DM - 2 tablets 2 times per day  4. Return to clinic in 4 weeks or earlier if problem   Deshon has not had very good control of her respiratory tract issue in months.  She has received systemic steroids on several occasions and has also been treated for a "pneumonia" with antibiotics and is on a very large collection of medical therapy directed against both inflammation of her airway and reflux induced respiratory disease yet still remains symptomatic.  We will now place her on an anti-TSLP antibody to address the inflammatory component of this issue and I have once again given her a short course of systemic steroids as she did demonstrate wheezing during today's exam.  There may also be a contribution of possible tracheomalacia but we can only deal with the manageable issues contributing to her cough at this point.  I will see her back in this clinic in 4 weeks or earlier if there is a problem.  Allena Katz, MD Allergy / Immunology [place]

## 2020-05-29 ENCOUNTER — Encounter: Payer: Self-pay | Admitting: Allergy and Immunology

## 2020-05-29 NOTE — Telephone Encounter (Signed)
Yes ok to send in refills for albuterol, fluticasone and montelukast.   If breztri is not helping, then she can switch back to her symbicort that she was previously on.   Thanks, Wille Glaser

## 2020-05-29 NOTE — Telephone Encounter (Signed)
Patient was seen by Dr. Neldon Mc yesterday and refills were refilled.  I did leave a detailed message on VM (DPR), with Dr. August Albino recommendations. Advised to call with any questions.  Nothing further at this time.

## 2020-06-03 ENCOUNTER — Telehealth: Payer: Self-pay | Admitting: *Deleted

## 2020-06-03 NOTE — Telephone Encounter (Signed)
Called to advise patient [facility] approval, copay card and submit to [facility].  Patient has already started sample so will advise to make appt for next injection

## 2020-06-25 ENCOUNTER — Ambulatory Visit (INDEPENDENT_AMBULATORY_CARE_PROVIDER_SITE_OTHER): Payer: BC Managed Care – PPO | Admitting: *Deleted

## 2020-06-25 ENCOUNTER — Other Ambulatory Visit: Payer: Self-pay

## 2020-06-25 DIAGNOSIS — J4550 Severe persistent asthma, uncomplicated: Secondary | ICD-10-CM | POA: Diagnosis not present

## 2020-07-01 ENCOUNTER — Other Ambulatory Visit: Payer: Self-pay

## 2020-07-01 ENCOUNTER — Ambulatory Visit (INDEPENDENT_AMBULATORY_CARE_PROVIDER_SITE_OTHER): Payer: BC Managed Care – PPO | Admitting: Obstetrics & Gynecology

## 2020-07-01 ENCOUNTER — Encounter: Payer: Self-pay | Admitting: Obstetrics & Gynecology

## 2020-07-01 VITALS — BP 122/80 | Ht 61.5 in | Wt 177.0 lb

## 2020-07-01 DIAGNOSIS — Z780 Asymptomatic menopausal state: Secondary | ICD-10-CM

## 2020-07-01 DIAGNOSIS — E6609 Other obesity due to excess calories: Secondary | ICD-10-CM

## 2020-07-01 DIAGNOSIS — Z6832 Body mass index (BMI) 32.0-32.9, adult: Secondary | ICD-10-CM | POA: Diagnosis not present

## 2020-07-01 DIAGNOSIS — Z01419 Encounter for gynecological examination (general) (routine) without abnormal findings: Secondary | ICD-10-CM

## 2020-07-01 NOTE — Progress Notes (Signed)
Ashley Eaton 16-Mar-1960 [id number]   History:    60 y.o. [id number]  Married.  [name], husband with DM.  Children doing well.  [name] is 33 yo, rising 9th grader.   RP:  Established patient presenting for annual gyn exam   HPI: Post menopause, well on no hormone replacement therapy.  No postmenopausal bleeding.  No pelvic pain.  No pain with intercourse.  Urine and bowel movements normal.  Breast normal.  Body mass index 32.9.  Healthy nutrition with vegetables.  Physically active with yard work especially.  Health labs with family physician.  Colono 2016, will schedule a repeat Colono this year.   Past medical history,surgical history, family history and social history were all reviewed and documented in the EPIC chart.  Gynecologic History No LMP recorded. Patient is postmenopausal.  Obstetric History OB History  Gravida Para Term Preterm AB Living  6 3     2 3   SAB IAB Ectopic Multiple Live Births  1            # Outcome Date GA Lbr Len/2nd Weight Sex Delivery Anes PTL Lv  6 Gravida           5 AB           4 SAB           3 Para           2 Para           1 Para              ROS: A ROS was performed and pertinent positives and negatives are included in the history.  GENERAL: No fevers or chills. HEENT: No change in vision, no earache, sore throat or sinus congestion. NECK: No pain or stiffness. CARDIOVASCULAR: No chest pain or pressure. No palpitations. PULMONARY: No shortness of breath, cough or wheeze. GASTROINTESTINAL: No abdominal pain, nausea, vomiting or diarrhea, melena or bright red blood per rectum. GENITOURINARY: No urinary frequency, urgency, hesitancy or dysuria. MUSCULOSKELETAL: No joint or muscle pain, no back pain, no recent trauma. DERMATOLOGIC: No rash, no itching, no lesions. ENDOCRINE: No polyuria, polydipsia, no heat or cold intolerance. No recent change in weight. HEMATOLOGICAL: No anemia or easy bruising or bleeding. NEUROLOGIC: No headache, seizures,  numbness, tingling or weakness. PSYCHIATRIC: No depression, no loss of interest in normal activity or change in sleep pattern.     Exam:   BP 122/80   Ht 5' 1.5" (1.562 m)   Wt 177 lb (80.3 kg)   BMI 32.90 kg/m   Body mass index is 32.9 kg/m.  General appearance : Well developed well nourished female. No acute distress HEENT: Eyes: no retinal hemorrhage or exudates,  Neck supple, trachea midline, no carotid bruits, no thyroidmegaly Lungs: Clear to auscultation, no rhonchi or wheezes, or rib retractions  Heart: Regular rate and rhythm, no murmurs or gallops Breast:Examined in sitting and supine position were symmetrical in appearance, no palpable masses or tenderness,  no skin retraction, no nipple inversion, no nipple discharge, no skin discoloration, no axillary or supraclavicular lymphadenopathy Abdomen: no palpable masses or tenderness, no rebound or guarding Extremities: no edema or skin discoloration or tenderness  Pelvic: Vulva: Normal             Vagina: No gross lesions or discharge  Cervix: No gross lesions or discharge  Uterus  AV, normal size, shape and consistency, non-tender and mobile  Adnexa  Without masses or tenderness  Anus: Normal  Assessment/Plan:  60 y.o. female for annual exam   1. Well female exam with routine gynecological exam Normal gynecologic exam.  Last Pap test June 2020, will repeat at 3 years next year.  Breast exam normal.  Screening mammogram was - August 2021.  Colonoscopy 2016, will schedule a repeat colonoscopy this year.  Health labs with family physician.  Receiving treatment for asthma and allergies.  2. Postmenopause Well on no hormone replacement therapy.  No postmenopausal bleeding.  Recommend vitamin D supplements, calcium intake of 1.5 g/day total and regular weightbearing physical activities.  3. Class 1 obesity due to excess calories without serious comorbidity with body mass index (BMI) of 32.0 to 32.9 in adult  Stable body mass  index.  Recommend a mildly lower calorie/carb diet.  Continue with fitness activities.  Princess Bruins MD, 10:07 AM 07/01/2020

## 2020-07-23 ENCOUNTER — Other Ambulatory Visit: Payer: Self-pay

## 2020-07-23 ENCOUNTER — Ambulatory Visit (INDEPENDENT_AMBULATORY_CARE_PROVIDER_SITE_OTHER): Payer: BC Managed Care – PPO

## 2020-07-23 DIAGNOSIS — J4550 Severe persistent asthma, uncomplicated: Secondary | ICD-10-CM

## 2020-08-12 ENCOUNTER — Encounter: Payer: Self-pay | Admitting: Obstetrics & Gynecology

## 2020-08-20 ENCOUNTER — Other Ambulatory Visit: Payer: Self-pay

## 2020-08-20 ENCOUNTER — Ambulatory Visit (INDEPENDENT_AMBULATORY_CARE_PROVIDER_SITE_OTHER): Payer: BC Managed Care – PPO | Admitting: *Deleted

## 2020-08-20 DIAGNOSIS — J4550 Severe persistent asthma, uncomplicated: Secondary | ICD-10-CM

## 2020-09-17 ENCOUNTER — Other Ambulatory Visit: Payer: Self-pay

## 2020-09-17 ENCOUNTER — Ambulatory Visit (INDEPENDENT_AMBULATORY_CARE_PROVIDER_SITE_OTHER): Payer: BC Managed Care – PPO | Admitting: *Deleted

## 2020-09-17 DIAGNOSIS — J4550 Severe persistent asthma, uncomplicated: Secondary | ICD-10-CM

## 2020-09-25 ENCOUNTER — Other Ambulatory Visit: Payer: Self-pay | Admitting: Pulmonary Disease

## 2020-10-15 ENCOUNTER — Ambulatory Visit (INDEPENDENT_AMBULATORY_CARE_PROVIDER_SITE_OTHER): Payer: BC Managed Care – PPO | Admitting: *Deleted

## 2020-10-15 ENCOUNTER — Other Ambulatory Visit: Payer: Self-pay

## 2020-10-15 DIAGNOSIS — J4550 Severe persistent asthma, uncomplicated: Secondary | ICD-10-CM | POA: Diagnosis not present

## 2020-10-19 ENCOUNTER — Other Ambulatory Visit: Payer: Self-pay

## 2020-10-19 ENCOUNTER — Encounter: Payer: Self-pay | Admitting: Allergy and Immunology

## 2020-10-19 ENCOUNTER — Ambulatory Visit: Payer: BC Managed Care – PPO | Admitting: Allergy and Immunology

## 2020-10-19 VITALS — BP 124/68 | HR 79 | Resp 18

## 2020-10-19 DIAGNOSIS — H6983 Other specified disorders of Eustachian tube, bilateral: Secondary | ICD-10-CM

## 2020-10-19 DIAGNOSIS — B999 Unspecified infectious disease: Secondary | ICD-10-CM | POA: Diagnosis not present

## 2020-10-19 DIAGNOSIS — J4550 Severe persistent asthma, uncomplicated: Secondary | ICD-10-CM

## 2020-10-19 DIAGNOSIS — K219 Gastro-esophageal reflux disease without esophagitis: Secondary | ICD-10-CM | POA: Diagnosis not present

## 2020-10-19 DIAGNOSIS — J3089 Other allergic rhinitis: Secondary | ICD-10-CM | POA: Diagnosis not present

## 2020-10-19 NOTE — Progress Notes (Signed)
[place] [facility] [place] [facility] [place]   Follow-up Note  Referring Provider: Ronita Hipps, MD Primary Provider: Ronita Hipps, MD Date of Office Visit: 10/19/2020  Subjective:   Ashley Eaton (DOB: 1960/09/20) is a 60 y.o. female who returns to the [facility] on 10/19/2020 in re-evaluation of the following:  HPI: Ashley Eaton returns to this clinic in evaluation of asthma, allergic rhinitis, and LPR.  I last saw her in this clinic on 28 May 2020.  During her last visit we started her on anti-TSLP antibody and she has done well.  She has done so well that she stopped her inhalers.  Unfortunately, about 3 weeks ago she contracted some type of viral respiratory tract infection and she used her daughter's doxycycline for 7 days and got a little bit better regarding her nasal congestion and head fullness and coughing.  However, over the course of the past week she has not been feeling well and she has had some shortness of breath and chest tightness and some cough and phlegm production and some raspy voice and she restarted her triple inhaler.  She may have started her triple inhaler about 2 or 3 weeks ago.  In addition to her lower respiratory tract symptoms and her upper respiratory tract symptoms she also has ear fullness.  She has no fever or ugly nasal discharge or anosmia or chest pain or sputum production at this point.  Allergies as of 10/19/2020       Reactions   Amoxicillin-pot Clavulanate Other (See Comments)   Hurts stomach   Other Other (See Comments)   Synthetic pain medication makes her heart race ?name   Prednisone Other (See Comments)   tablet form, upset stomach, states it makes her crazy        Medication List    albuterol 108 (90 Base) MCG/ACT inhaler Commonly known as: VENTOLIN HFA 2 puffs every 4-6 hours as needed   Azelastine HCl 137 MCG/SPRAY Soln Place 2 sprays into both nostrils 2 (two) times daily.   Breztri Aerosphere  160-9-4.8 MCG/ACT Aero Generic drug: Budeson-Glycopyrrol-Formoterol 2 puffs 2 times per day with spacer   fluticasone 50 MCG/ACT nasal spray Commonly known as: FLONASE Use one spray in each nostril twice daily as directed   ipratropium 0.03 % nasal spray Commonly known as: ATROVENT Place 2 sprays into both nostrils every 12 (twelve) hours.   loratadine 10 MG tablet Commonly known as: CLARITIN Take 1 tablet (10 mg total) by mouth daily.   montelukast 10 MG tablet Commonly known as: Singulair Take one tablet once daily as directed   omeprazole 40 MG capsule Commonly known as: PRILOSEC Take 40 mg by mouth every morning.    Past Medical History:  Diagnosis Date   Asthma    Asthma    Cancer ([place]) 06?   vulva   Complication of anesthesia    LPRD (laryngopharyngeal reflux disease)    Neuromuscular disorder (HCC)    Morton's neuroma, plantar fasciitis s/p 10   PONV (postoperative nausea and vomiting)     Past Surgical History:  Procedure Laterality Date   ABLATION SAPHENOUS VEIN W/ RFA Left 2021   CERVICAL CONIZATION W/BX  06   CHOLECYSTECTOMY N/A 10/15/2013   Procedure: LAPAROSCOPIC CHOLECYSTECTOMY WITH INTRAOPERATIVE CHOLANGIOGRAM;  Surgeon: Donnie Mesa, MD;  Location: [facility];  Service: General;  Laterality: N/A;   COLONOSCOPY  12/2014   with Dr. Collene Mares next one in 5 yrs   DIAGNOSTIC  LAPAROSCOPY     endometriosis- 60 yrs old   FOOT SURGERY Bilateral 2010,2011   For Plantar Faciitis   FOOT SURGERY Bilateral [phone]   For Morton's Neuroma    Review of systems negative except as noted in HPI / PMHx or noted below:  Review of Systems  Constitutional: Negative.   HENT: Negative.    Eyes: Negative.   Respiratory: Negative.    Cardiovascular: Negative.   Gastrointestinal: Negative.   Genitourinary: Negative.   Musculoskeletal: Negative.   Skin: Negative.   Neurological: Negative.   Endo/Heme/Allergies: Negative.   Psychiatric/Behavioral: Negative.       Objective:   Vitals:   10/19/20 1732  BP: 124/68  Pulse: 79  Resp: 18  SpO2: 98%          Physical Exam Constitutional:      Appearance: She is not diaphoretic.     Comments: Raspy voice  HENT:     Head: Normocephalic.     Right Ear: Ear canal and external ear normal. A middle ear effusion is present.     Left Ear: Ear canal and external ear normal. A middle ear effusion is present.     Nose: Nose normal. No mucosal edema or rhinorrhea.     Mouth/Throat:     Pharynx: Uvula midline. No oropharyngeal exudate.  Eyes:     Conjunctiva/sclera: Conjunctivae normal.  Neck:     Thyroid: No thyromegaly.     Trachea: Trachea normal. No tracheal tenderness or tracheal deviation.  Cardiovascular:     Rate and Rhythm: Normal rate and regular rhythm.     Heart sounds: Normal heart sounds, S1 normal and S2 normal. No murmur heard. Pulmonary:     Effort: No respiratory distress.     Breath sounds: Normal breath sounds. No stridor. No wheezing or rales.  Lymphadenopathy:     Head:     Right side of head: No tonsillar adenopathy.     Left side of head: No tonsillar adenopathy.     Cervical: No cervical adenopathy.  Skin:    Findings: No erythema or rash.     Nails: There is no clubbing.  Neurological:     Mental Status: She is alert.    Diagnostics: none  Assessment and Plan:   1. Not well controlled severe persistent asthma   2. Recurrent infections   3. Other allergic rhinitis   4. LPRD (laryngopharyngeal reflux disease)   5. Dysfunction of both eustachian tubes     1.  Continue to treat this inflammatory flare up:   A. Flonase 1 spray each nostril 2 times per day  B. montelukast 10 mg tablet 1 time per day  C. Breztri - 2 inhalations 2 times per day with spacer   D. Tezepelumab injection today and every 4 weeks  E.  Prednisone 10 mg - 1 tablet 1 time per day for 10 days only   2.  Continue to treat and prevent reflux:   A.  omeprazole 40 mg 2 times a day  3.  If needed:      A. OTC antihistamine  B. OTC nasal saline   C. Proair HFA 2 inhalations every 4-6 hours  D. Mucinex DM - 2 tablets 2 times per day  4. Use Breztri and Flonase PREVENTATIVELY   5. Screen for immune defect: IgA/G/M, anti-pneumo antibodies  6. Return to clinic in 12 weeks or earlier if problem  7. Obtain fall flu vaccine  It appears that Vyolet has contracted  another infection and this infection is most likely viral that is giving rise to a significant flare of both her upper and lower airways.  She certainly does contract a fair amount of infections and we are going to screen her immune system with the blood test noted above in investigation of this issue.  I encouraged her to use her anti-inflammatory agents for her airway on a consistent basis in a preventative mode and I have given her a short course of systemic steroids today while she also continues to use her anti-TSLP antibody.  She will continue on therapy for reflux.  I will see her back in this clinic in 12 weeks or earlier if there is a problem.   Allena Katz, MD Allergy / Immunology [place]

## 2020-10-19 NOTE — Patient Instructions (Addendum)
  1.  Continue to treat this inflammatory flare up:   A. Flonase 1 spray each nostril 2 times per day  B. montelukast 10 mg tablet 1 time per day  C. Breztri - 2 inhalations 2 times per day with spacer   D. Tezepelumab injection today and every 4 weeks  E. Prednisone 10 mg - 1 tablet 1 time per day for 10 days only   2.  Continue to treat and prevent reflux:   A.  omeprazole 40 mg 2 times a day  3. If needed:      A. OTC antihistamine  B. OTC nasal saline   C. Proair HFA 2 inhalations every 4-6 hours  D. Mucinex DM - 2 tablets 2 times per day  4. Use Breztri and Flonase PREVENTATIVELY   5. Screen for immune defect: IgA/G/M, anti-pneumo antibodies  6. Return to clinic in 12 weeks or earlier if problem  7. Obtain fall flu vaccine

## 2020-10-20 ENCOUNTER — Encounter: Payer: Self-pay | Admitting: Allergy and Immunology

## 2020-10-30 LAB — STREP PNEUMONIAE 23 SEROTYPES IGG
Pneumo Ab Type 1*: <0.1 ug/mL — ABNORMAL LOW (ref >1.3)
Pneumo Ab Type 12 (12F)*: <0.1 ug/mL — ABNORMAL LOW (ref >1.3)
Pneumo Ab Type 14*: 0.2 ug/mL — ABNORMAL LOW (ref >1.3)
Pneumo Ab Type 17 (17F)*: <0.1 ug/mL — ABNORMAL LOW (ref >1.3)
Pneumo Ab Type 19 (19F)*: 0.4 ug/mL — ABNORMAL LOW (ref >1.3)
Pneumo Ab Type 2*: <0.1 ug/mL — ABNORMAL LOW (ref >1.3)
Pneumo Ab Type 20*: <0.1 ug/mL — ABNORMAL LOW (ref >1.3)
Pneumo Ab Type 22 (22F)*: <0.1 ug/mL — ABNORMAL LOW (ref >1.3)
Pneumo Ab Type 23 (23F)*: <0.1 ug/mL — ABNORMAL LOW (ref >1.3)
Pneumo Ab Type 26 (6B)*: <0.1 ug/mL — ABNORMAL LOW (ref >1.3)
Pneumo Ab Type 3*: 0.2 ug/mL — ABNORMAL LOW (ref >1.3)
Pneumo Ab Type 34 (10A)*: <0.1 ug/mL — ABNORMAL LOW (ref >1.3)
Pneumo Ab Type 4*: <0.1 ug/mL — ABNORMAL LOW (ref >1.3)
Pneumo Ab Type 43 (11A)*: <0.1 ug/mL — ABNORMAL LOW (ref >1.3)
Pneumo Ab Type 5*: <0.1 ug/mL — ABNORMAL LOW (ref >1.3)
Pneumo Ab Type 51 (7F)*: <0.1 ug/mL — ABNORMAL LOW (ref >1.3)
Pneumo Ab Type 54 (15B)*: <0.1 ug/mL — ABNORMAL LOW (ref >1.3)
Pneumo Ab Type 56 (18C)*: 0.1 ug/mL — ABNORMAL LOW (ref >1.3)
Pneumo Ab Type 57 (19A)*: 0.1 ug/mL — ABNORMAL LOW (ref >1.3)
Pneumo Ab Type 68 (9V)*: <0.1 ug/mL — ABNORMAL LOW (ref >1.3)
Pneumo Ab Type 70 (33F)*: <0.1 ug/mL — ABNORMAL LOW (ref >1.3)
Pneumo Ab Type 8*: <0.1 ug/mL — ABNORMAL LOW (ref >1.3)
Pneumo Ab Type 9 (9N)*: <0.1 ug/mL — ABNORMAL LOW (ref >1.3)

## 2020-10-30 LAB — IGG, IGA, IGM
IgA/Immunoglobulin A, Serum: 258 mg/dL (ref 87–352)
IgG (Immunoglobin G), Serum: 1015 mg/dL (ref 586–1602)
IgM (Immunoglobulin M), Srm: 88 mg/dL (ref 26–217)

## 2020-11-12 ENCOUNTER — Other Ambulatory Visit: Payer: Self-pay

## 2020-11-12 ENCOUNTER — Ambulatory Visit (INDEPENDENT_AMBULATORY_CARE_PROVIDER_SITE_OTHER): Payer: BC Managed Care – PPO

## 2020-11-12 DIAGNOSIS — J4550 Severe persistent asthma, uncomplicated: Secondary | ICD-10-CM | POA: Diagnosis not present

## 2020-11-25 ENCOUNTER — Ambulatory Visit: Payer: BC Managed Care – PPO | Admitting: Allergy and Immunology

## 2020-12-10 ENCOUNTER — Other Ambulatory Visit: Payer: Self-pay

## 2020-12-10 ENCOUNTER — Ambulatory Visit (INDEPENDENT_AMBULATORY_CARE_PROVIDER_SITE_OTHER): Payer: BC Managed Care – PPO | Admitting: *Deleted

## 2020-12-10 DIAGNOSIS — J4550 Severe persistent asthma, uncomplicated: Secondary | ICD-10-CM

## 2020-12-17 ENCOUNTER — Telehealth: Payer: Self-pay | Admitting: Allergy and Immunology

## 2020-12-17 MED ORDER — ALBUTEROL SULFATE HFA 108 (90 BASE) MCG/ACT IN AERS: INHALATION_SPRAY | RESPIRATORY_TRACT | 1 refills | Status: DC

## 2020-12-17 NOTE — Telephone Encounter (Signed)
Patient states she started to feel miserable and went to [facility]. They tested her for the Flu which came back positive. She is out of her rescue inhaler and is requesting a refill.

## 2020-12-17 NOTE — Telephone Encounter (Signed)
Patient states she is not feeling well. She started with a cough last night, has congestion, pressure and back/chest feels tight.   Best pharmacy- [facility] in [place]

## 2020-12-17 NOTE — Telephone Encounter (Signed)
Ventolin sent to [facility]. Left detailed message per dpr.

## 2020-12-17 NOTE — Telephone Encounter (Signed)
Please advise 

## 2020-12-17 NOTE — Telephone Encounter (Signed)
Pt called back and states she is on Tamiflu, prednisone and ProAir.

## 2020-12-17 NOTE — Telephone Encounter (Signed)
Please make sure that Ashley Eaton has a rescue inhaler to use.  And make sure that she is on Tamiflu.

## 2021-01-07 ENCOUNTER — Other Ambulatory Visit: Payer: Self-pay

## 2021-01-07 ENCOUNTER — Ambulatory Visit (INDEPENDENT_AMBULATORY_CARE_PROVIDER_SITE_OTHER): Payer: BC Managed Care – PPO | Admitting: *Deleted

## 2021-01-07 DIAGNOSIS — J4550 Severe persistent asthma, uncomplicated: Secondary | ICD-10-CM

## 2021-01-11 ENCOUNTER — Ambulatory Visit: Payer: BC Managed Care – PPO | Admitting: Allergy and Immunology

## 2021-01-11 ENCOUNTER — Other Ambulatory Visit: Payer: Self-pay

## 2021-01-11 ENCOUNTER — Encounter: Payer: Self-pay | Admitting: Allergy and Immunology

## 2021-01-11 VITALS — BP 112/86 | HR 74 | Resp 16

## 2021-01-11 DIAGNOSIS — B999 Unspecified infectious disease: Secondary | ICD-10-CM | POA: Diagnosis not present

## 2021-01-11 DIAGNOSIS — J3089 Other allergic rhinitis: Secondary | ICD-10-CM

## 2021-01-11 DIAGNOSIS — J4550 Severe persistent asthma, uncomplicated: Secondary | ICD-10-CM

## 2021-01-11 DIAGNOSIS — K219 Gastro-esophageal reflux disease without esophagitis: Secondary | ICD-10-CM

## 2021-01-11 NOTE — Progress Notes (Signed)
[place] [facility] [place] [facility] [place]   Follow-up Note  Referring Provider: Ronita Hipps, MD Primary Provider: Ronita Hipps, MD Date of Office Visit: 01/11/2021  Subjective:   Ashley Eaton (DOB: 06/03/1960) is a 61 y.o. female who returns to the [facility] on 01/11/2021 in re-evaluation of the following:  HPI: Mettie returns to this clinic in reevaluation of asthma, allergic rhinitis and LPR.  Her last visit in this clinic with me was 19 October 2020.  Unfortunately she contracted flu on 17 December 2020.  Fortunately she did receive Tamiflu and prednisone and she has done a lot better since that event.  For the most part she was doing well prior to that infection and she did not require systemic steroid or an antibiotic for any type of airway issue and she rarely uses short acting bronchodilator and overall was very pleased with the type of response that she was receiving with her current plan which includes anti-TSLP antibody administration.  Her reflux is still an active issue.  She still has intermittent regurgitation.  She has been using her omeprazole mostly just 1 time per day.  She did receive the flu vaccine and she received the Pneumovax.  Allergies as of 01/11/2021       Reactions   Amoxicillin-pot Clavulanate Other (See Comments)   Hurts stomach   Other Other (See Comments)   Synthetic pain medication makes her heart race ?name   Prednisone Other (See Comments)   tablet form, upset stomach, states it makes her crazy        Medication List    albuterol 108 (90 Base) MCG/ACT inhaler Commonly known as: VENTOLIN HFA 2 puffs every 4-6 hours as needed   Azelastine HCl 137 MCG/SPRAY Soln Place 2 sprays into both nostrils 2 (two) times daily.   Breztri Aerosphere 160-9-4.8 MCG/ACT Aero Generic drug: Budeson-Glycopyrrol-Formoterol 2 puffs 2 times per day with spacer   fluticasone 50 MCG/ACT nasal spray Commonly known as:  FLONASE Use one spray in each nostril twice daily as directed   ipratropium 0.03 % nasal spray Commonly known as: ATROVENT Place 2 sprays into both nostrils every 12 (twelve) hours.   loratadine 10 MG tablet Commonly known as: CLARITIN Take 1 tablet (10 mg total) by mouth daily.   montelukast 10 MG tablet Commonly known as: Singulair Take one tablet once daily as directed   omeprazole 40 MG capsule Commonly known as: PRILOSEC Take 40 mg by mouth every morning.    Past Medical History:  Diagnosis Date   Asthma    Asthma    Cancer ([facility]) 06?   vulva   Complication of anesthesia    LPRD (laryngopharyngeal reflux disease)    Neuromuscular disorder (HCC)    Morton's neuroma, plantar fasciitis s/p 10   PONV (postoperative nausea and vomiting)     Past Surgical History:  Procedure Laterality Date   ABLATION SAPHENOUS VEIN W/ RFA Left 2021   CERVICAL CONIZATION W/BX  06   CHOLECYSTECTOMY N/A 10/15/2013   Procedure: LAPAROSCOPIC CHOLECYSTECTOMY WITH INTRAOPERATIVE CHOLANGIOGRAM;  Surgeon: Donnie Mesa, MD;  Location: [facility];  Service: General;  Laterality: N/A;   COLONOSCOPY  12/2014   with Dr. Collene Mares next one in 5 yrs   DIAGNOSTIC LAPAROSCOPY     endometriosis- 61 yrs old   FOOT SURGERY Bilateral 2010,2011   For Plantar Faciitis   FOOT SURGERY Bilateral 1992,95   For Morton's Neuroma    Review of  systems negative except as noted in HPI / PMHx or noted below:  Review of Systems  Constitutional: Negative.   HENT: Negative.    Eyes: Negative.   Respiratory: Negative.    Cardiovascular: Negative.   Gastrointestinal: Negative.   Genitourinary: Negative.   Musculoskeletal: Negative.   Skin: Negative.   Neurological: Negative.   Endo/Heme/Allergies: Negative.   Psychiatric/Behavioral: Negative.      Objective:   Vitals:   01/11/21 1732  BP: 112/86  Pulse: 74  Resp: 16  SpO2: 95%          Physical Exam Constitutional:      Appearance: She is not  diaphoretic.  HENT:     Head: Normocephalic.     Right Ear: Tympanic membrane, ear canal and external ear normal.     Left Ear: Tympanic membrane, ear canal and external ear normal.     Nose: Nose normal. No mucosal edema or rhinorrhea.     Mouth/Throat:     Pharynx: Uvula midline. No oropharyngeal exudate.  Eyes:     Conjunctiva/sclera: Conjunctivae normal.  Neck:     Thyroid: No thyromegaly.     Trachea: Trachea normal. No tracheal tenderness or tracheal deviation.  Cardiovascular:     Rate and Rhythm: Normal rate and regular rhythm.     Heart sounds: Normal heart sounds, S1 normal and S2 normal. No murmur heard. Pulmonary:     Effort: No respiratory distress.     Breath sounds: Normal breath sounds. No stridor. No wheezing or rales.  Lymphadenopathy:     Head:     Right side of head: No tonsillar adenopathy.     Left side of head: No tonsillar adenopathy.     Cervical: No cervical adenopathy.  Skin:    Findings: No erythema or rash.     Nails: There is no clubbing.  Neurological:     Mental Status: She is alert.    Diagnostics:    Spirometry was performed and demonstrated an FEV1 of 1.91 at 83 % of predicted.  Results of blood tests obtained 20 October 2020 identifies IgG 1015 mg/DL, IgA 258 NG/DL, IgM of 88 mg/DL, no adequate protective antibody levels directed at pneumococcal serotypes identified with a Pneumo 23 serotype assay.  Assessment and Plan:   1. Asthma, severe persistent, well-controlled   2. Other allergic rhinitis   3. LPRD (laryngopharyngeal reflux disease)   4. Recurrent infections    1.  Continue to treat this inflammatory flare up:   A. Flonase 1 spray each nostril 1-2 times per day  B. montelukast 10 mg tablet 1 time per day  C. Breztri - 2 inhalations 1-2 times per day with spacer   D. Tezepelumab injection every 4 weeks    2.  Continue to treat and prevent reflux:   A.  omeprazole 40 mg 1-2 times a day  3. If needed:      A. OTC  antihistamine  B. OTC nasal saline   C. Proair HFA 2 inhalations every 4-6 hours  D. Mucinex DM - 2 tablets 2 times per day  4. Blood - Pneumo 23 serotype assay  5. Return to clinic in 6 months or earlier if problem  Keerstin will use anti-inflammatory agents for her airway in the form of an anti-TSLP antibody and a triple inhaler and a leukotriene modifier and a nasal steroid.  The dosing of these medications will be dependent on her disease activity.  If she is doing well she can try  to use her Flonase and Judithann Sauger just once time per day but she can certainly increase it to twice a day if she is having some difficulty.  Likewise, the dose of her omeprazole will be dependent on her disease activity and if she is having problems with reflux she can increase this omeprazole to twice a day.  She did have a low level of antipneumococcal antibodies documented during her last blood test and we will now see if she has a significant increase in antibody levels as a result of her Pneumovax administration by checking a Pneumo 23 serotype assay.  I will contact her with the results of those blood test once they are available for review.  I will see her back in this clinic in 6 months or earlier if there is a problem.   Allena Katz, MD Allergy / Immunology [place]

## 2021-01-11 NOTE — Patient Instructions (Addendum)
°  1.  Continue to treat this inflammatory flare up:   A. Flonase 1 spray each nostril 1-2 times per day  B. montelukast 10 mg tablet 1 time per day  C. Breztri - 2 inhalations 1-2 times per day with spacer   D. Tezepelumab injection every 4 weeks    2.  Continue to treat and prevent reflux:   A.  omeprazole 40 mg 1-2 times a day  3. If needed:      A. OTC antihistamine  B. OTC nasal saline   C. Proair HFA 2 inhalations every 4-6 hours  D. Mucinex DM - 2 tablets 2 times per day  4. Blood - Pneumo 23 serotype assay  5. Return to clinic in 6 months or earlier if problem

## 2021-01-12 ENCOUNTER — Encounter: Payer: Self-pay | Admitting: Allergy and Immunology

## 2021-02-01 ENCOUNTER — Other Ambulatory Visit: Payer: Self-pay | Admitting: Allergy and Immunology

## 2021-02-04 ENCOUNTER — Other Ambulatory Visit: Payer: Self-pay

## 2021-02-04 ENCOUNTER — Ambulatory Visit (INDEPENDENT_AMBULATORY_CARE_PROVIDER_SITE_OTHER): Payer: BC Managed Care – PPO | Admitting: *Deleted

## 2021-02-04 DIAGNOSIS — J4550 Severe persistent asthma, uncomplicated: Secondary | ICD-10-CM

## 2021-02-20 LAB — STREP PNEUMONIAE 23 SEROTYPES IGG
Pneumo Ab Type 1*: 0.2 ug/mL — ABNORMAL LOW (ref >1.3)
Pneumo Ab Type 12 (12F)*: <0.1 ug/mL — ABNORMAL LOW (ref >1.3)
Pneumo Ab Type 14*: 1 ug/mL — ABNORMAL LOW (ref >1.3)
Pneumo Ab Type 17 (17F)*: 0.7 ug/mL — ABNORMAL LOW (ref >1.3)
Pneumo Ab Type 19 (19F)*: 2.1 ug/mL (ref >1.3)
Pneumo Ab Type 2*: 0.7 ug/mL — ABNORMAL LOW (ref >1.3)
Pneumo Ab Type 20*: 3.4 ug/mL (ref >1.3)
Pneumo Ab Type 22 (22F)*: 0.8 ug/mL — ABNORMAL LOW (ref >1.3)
Pneumo Ab Type 23 (23F)*: <0.1 ug/mL — ABNORMAL LOW (ref >1.3)
Pneumo Ab Type 26 (6B)*: <0.1 ug/mL — ABNORMAL LOW (ref >1.3)
Pneumo Ab Type 3*: 0.1 ug/mL — ABNORMAL LOW (ref >1.3)
Pneumo Ab Type 34 (10A)*: 0.2 ug/mL — ABNORMAL LOW (ref >1.3)
Pneumo Ab Type 4*: 0.4 ug/mL — ABNORMAL LOW (ref >1.3)
Pneumo Ab Type 43 (11A)*: 0.2 ug/mL — ABNORMAL LOW (ref >1.3)
Pneumo Ab Type 5*: <0.1 ug/mL — ABNORMAL LOW (ref >1.3)
Pneumo Ab Type 51 (7F)*: <0.1 ug/mL — ABNORMAL LOW (ref >1.3)
Pneumo Ab Type 54 (15B)*: 0.7 ug/mL — ABNORMAL LOW (ref >1.3)
Pneumo Ab Type 56 (18C)*: 0.2 ug/mL — ABNORMAL LOW (ref >1.3)
Pneumo Ab Type 57 (19A)*: 0.5 ug/mL — ABNORMAL LOW (ref >1.3)
Pneumo Ab Type 68 (9V)*: 0.3 ug/mL — ABNORMAL LOW (ref >1.3)
Pneumo Ab Type 70 (33F)*: 1.6 ug/mL (ref >1.3)
Pneumo Ab Type 8*: 2.8 ug/mL (ref >1.3)
Pneumo Ab Type 9 (9N)*: 0.2 ug/mL — ABNORMAL LOW (ref >1.3)

## 2021-02-24 ENCOUNTER — Other Ambulatory Visit: Payer: Self-pay | Admitting: Allergy and Immunology

## 2021-02-25 ENCOUNTER — Telehealth: Payer: Self-pay

## 2021-02-25 NOTE — Telephone Encounter (Signed)
When I informed Ashley Eaton of her marginal response to the Pneumovax, she had a few questions she wanted me to ask you.  First, why would her titer lows be staying low? Also, is there anything we can do to help her levels improve? She still has trouble with her symptoms and her next return visit is not until July. Please advise.

## 2021-03-04 ENCOUNTER — Other Ambulatory Visit: Payer: Self-pay

## 2021-03-04 ENCOUNTER — Ambulatory Visit (INDEPENDENT_AMBULATORY_CARE_PROVIDER_SITE_OTHER): Payer: BC Managed Care – PPO | Admitting: *Deleted

## 2021-03-04 DIAGNOSIS — J4550 Severe persistent asthma, uncomplicated: Secondary | ICD-10-CM

## 2021-03-18 ENCOUNTER — Other Ambulatory Visit: Payer: Self-pay | Admitting: Allergy and Immunology

## 2021-03-22 ENCOUNTER — Other Ambulatory Visit: Payer: Self-pay

## 2021-03-22 MED ORDER — OMEPRAZOLE 40 MG PO CPDR
DELAYED_RELEASE_CAPSULE | ORAL | 5 refills | Status: DC
Start: 2021-03-22 — End: 2021-11-24

## 2021-04-01 ENCOUNTER — Ambulatory Visit (INDEPENDENT_AMBULATORY_CARE_PROVIDER_SITE_OTHER): Payer: BC Managed Care – PPO | Admitting: *Deleted

## 2021-04-01 DIAGNOSIS — J4550 Severe persistent asthma, uncomplicated: Secondary | ICD-10-CM

## 2021-04-29 ENCOUNTER — Ambulatory Visit (INDEPENDENT_AMBULATORY_CARE_PROVIDER_SITE_OTHER): Payer: BC Managed Care – PPO | Admitting: *Deleted

## 2021-04-29 DIAGNOSIS — J4550 Severe persistent asthma, uncomplicated: Secondary | ICD-10-CM

## 2021-05-24 ENCOUNTER — Other Ambulatory Visit: Payer: Self-pay | Admitting: Allergy and Immunology

## 2021-05-27 ENCOUNTER — Ambulatory Visit (INDEPENDENT_AMBULATORY_CARE_PROVIDER_SITE_OTHER): Payer: BC Managed Care – PPO

## 2021-05-27 DIAGNOSIS — J4550 Severe persistent asthma, uncomplicated: Secondary | ICD-10-CM | POA: Diagnosis not present

## 2021-05-30 ENCOUNTER — Other Ambulatory Visit: Payer: Self-pay | Admitting: Allergy and Immunology

## 2021-06-14 ENCOUNTER — Telehealth: Payer: Self-pay | Admitting: *Deleted

## 2021-06-14 ENCOUNTER — Encounter: Payer: Self-pay | Admitting: Allergy and Immunology

## 2021-06-14 ENCOUNTER — Ambulatory Visit: Payer: BC Managed Care – PPO | Admitting: Allergy and Immunology

## 2021-06-14 VITALS — BP 128/84 | HR 84 | Resp 20 | Ht 61.5 in | Wt 182.0 lb

## 2021-06-14 DIAGNOSIS — H6983 Other specified disorders of Eustachian tube, bilateral: Secondary | ICD-10-CM | POA: Diagnosis not present

## 2021-06-14 DIAGNOSIS — K219 Gastro-esophageal reflux disease without esophagitis: Secondary | ICD-10-CM

## 2021-06-14 DIAGNOSIS — J3089 Other allergic rhinitis: Secondary | ICD-10-CM

## 2021-06-14 DIAGNOSIS — J4551 Severe persistent asthma with (acute) exacerbation: Secondary | ICD-10-CM

## 2021-06-14 DIAGNOSIS — J4550 Severe persistent asthma, uncomplicated: Secondary | ICD-10-CM

## 2021-06-14 MED ORDER — METHYLPREDNISOLONE ACETATE 80 MG/ML IJ SUSP
80.0000 mg | Freq: Once | INTRAMUSCULAR | Status: AC
  Administered 2021-06-14: 80 mg via INTRAMUSCULAR

## 2021-06-14 MED ORDER — AZITHROMYCIN 500 MG PO TABS: ORAL_TABLET | ORAL | 0 refills | Status: DC

## 2021-06-14 MED ORDER — HYDROCOD POLI-CHLORPHE POLI ER 10-8 MG/5ML PO SUER: ORAL | 0 refills | Status: DC

## 2021-06-14 NOTE — Telephone Encounter (Signed)
Coming in this afternoon 

## 2021-06-14 NOTE — Progress Notes (Signed)
[facility] [facility] [place] [facility] [place]   Follow-up Note  Referring Provider: Ronita Hipps, MD Primary Provider: Ronita Hipps, MD Date of Office Visit: 06/14/2021  Subjective:   Ashley Eaton (DOB: 21-Jul-1960) is a 61 y.o. female who returns to the [facility] on 06/14/2021 in re-evaluation of the following:  HPI: Ashley Eaton presents to this clinic in evaluation of asthma and allergic rhinitis and LPR.  I have not seen her in the clinic since 11 November 2020.  She did well for approximately 6 months without any significant respiratory tract problems and no need for systemic steroid or an antibiotic while she continued on tezepelumab, Breztri 1 time per day, Flonase 1 time per day, and montelukast.  As well, her reflux is under very good control using omeprazole twice a day.  Unfortunately, about 6 days ago she developed acute onset of cough and tickle in her throat and this has progressed to the point where she is having gagging and phlegm production and some posttussive emesis.  She is also little bit short of breath.  She has not had any fever or ugly nasal discharge or ugly sputum production or chest pain or other associated systemic or constitutional symptoms.  She was apparently tested for COVID and flu 3 days ago which was negative.  She was given a low-dose Medrol Dosepak.  She still continues to cough like crazy and she is losing sleep over this issue.  Allergies as of 06/14/2021       Reactions   Amoxicillin-pot Clavulanate Other (See Comments)   Hurts stomach   Other Other (See Comments)   Synthetic pain medication makes her heart race ?name   Prednisone Other (See Comments)   tablet form, upset stomach, states it makes her crazy        Medication List    albuterol 108 (90 Base) MCG/ACT inhaler Commonly known as: VENTOLIN HFA 2 puffs every 4-6 hours as needed   Breztri Aerosphere 160-9-4.8 MCG/ACT Aero Generic drug:  Budeson-Glycopyrrol-Formoterol INHALE 2 PUFFS TWO TIMES DAILIY WITH SPACER   fluticasone 50 MCG/ACT nasal spray Commonly known as: FLONASE USE ONE SPRAY IN EACH NOSTRIL TWICE DAILY AS DIRECTED   loratadine 10 MG tablet Commonly known as: CLARITIN TAKE 1 TABLET BY MOUTH EVERY DAY   methylPREDNISolone 4 MG Tbpk tablet Commonly known as: MEDROL DOSEPAK TAKE 6 TABLETS ON DAY 1 AS DIRECTED ON PACKAGE AND DECREASE BY 1 TAB EACH DAY FOR A TOTAL OF 6 DAYS   montelukast 10 MG tablet Commonly known as: SINGULAIR TAKE ONE TABLET ONCE DAILY AS DIRECTED   omeprazole 40 MG capsule Commonly known as: PRILOSEC Take 1 capsule 2 times daily   Tezspire 210 MG/1.91ML syringe Generic drug: tezepelumab-ekko INJECT 210 MG UNDER THE SKIN EVERY 4 WEEKS    Past Medical History:  Diagnosis Date   Asthma    Asthma    Cancer ([facility]) 06?   vulva   Complication of anesthesia    LPRD (laryngopharyngeal reflux disease)    Neuromuscular disorder (HCC)    Morton's neuroma, plantar fasciitis s/p 10   PONV (postoperative nausea and vomiting)     Past Surgical History:  Procedure Laterality Date   ABLATION SAPHENOUS VEIN W/ RFA Left 2021   CERVICAL CONIZATION W/BX  06   CHOLECYSTECTOMY N/A 10/15/2013   Procedure: LAPAROSCOPIC CHOLECYSTECTOMY WITH INTRAOPERATIVE CHOLANGIOGRAM;  Surgeon: Donnie Mesa, MD;  Location: [facility];  Service: General;  Laterality: N/A;  COLONOSCOPY  12/2014   with Dr. Collene Mares next one in 5 yrs   DIAGNOSTIC LAPAROSCOPY     endometriosis- 61 yrs old   FOOT SURGERY Bilateral 2010,2011   For Plantar Faciitis   FOOT SURGERY Bilateral [phone]   For Morton's Neuroma    Review of systems negative except as noted in HPI / PMHx or noted below:  Review of Systems  Constitutional: Negative.   HENT: Negative.    Eyes: Negative.   Respiratory: Negative.    Cardiovascular: Negative.   Gastrointestinal: Negative.   Genitourinary: Negative.   Musculoskeletal: Negative.   Skin:  Negative.   Neurological: Negative.   Endo/Heme/Allergies: Negative.   Psychiatric/Behavioral: Negative.       Objective:   Vitals:   06/14/21 1645  BP: 128/84  Pulse: 84  Resp: 20  SpO2: 96%   Height: 5' 1.5" (156.2 cm)  Weight: 182 lb (82.6 kg)   Physical Exam Constitutional:      Appearance: She is not diaphoretic.  HENT:     Head: Normocephalic.     Right Ear: Ear canal and external ear normal. A middle ear effusion is present.     Left Ear: Ear canal and external ear normal. A middle ear effusion is present.     Nose: Nose normal. No mucosal edema or rhinorrhea.     Mouth/Throat:     Pharynx: Uvula midline. No oropharyngeal exudate.  Eyes:     Conjunctiva/sclera: Conjunctivae normal.  Neck:     Thyroid: No thyromegaly.     Trachea: Trachea normal. No tracheal tenderness or tracheal deviation.  Cardiovascular:     Rate and Rhythm: Normal rate and regular rhythm.     Heart sounds: Normal heart sounds, S1 normal and S2 normal. No murmur heard. Pulmonary:     Effort: No respiratory distress.     Breath sounds: No stridor. Wheezing (End expiratory wheezing right posterior lung field) present. No rales.  Lymphadenopathy:     Head:     Right side of head: No tonsillar adenopathy.     Left side of head: No tonsillar adenopathy.     Cervical: No cervical adenopathy.  Skin:    Findings: No erythema or rash.     Nails: There is no clubbing.  Neurological:     Mental Status: She is alert.     Diagnostics:    Spirometry was performed and demonstrated an FEV1 of 1.50 at 66 % of predicted.  Assessment and Plan:   1. Asthma, not well controlled, severe persistent, with acute exacerbation   2. Other allergic rhinitis   3. LPRD (laryngopharyngeal reflux disease)   4. Dysfunction of both eustachian tubes     1.  Continue to treat this inflammatory flare up:   A. Flonase 1 spray each nostril 1-2 times per day  B. montelukast 10 mg tablet 1 time per day  C.  Breztri - 2 inhalations 1-2 times per day with spacer   D. Tezepelumab injection every 4 weeks   2.  Continue to treat and prevent reflux:   A.  omeprazole 40 mg 1-2 times a day  3. If needed:      A. OTC antihistamine  B. OTC nasal saline   C. Proair HFA 2 inhalations every 4-6 hours  D. Mucinex DM - 2 tablets 2 times per day  4. For this recent episode:   A.  Depomedrol 80 mg IM delivered in clinic today  B.  Azithromycin 500 mg -1 tablet  1 time per day for 3 days only  C.  Tussionex -2.5-5.0 mL every 12 hours for cough. NARCOTIC. 60 mls  5. Return to clinic in 6 months or earlier if problem  Ashley Eaton appears to have a asthma exacerbation most likely triggered by a viral respiratory tract infection but we will cover her for possible mycoplasma with azithromycin at the same time that we give her systemic steroid and help control her unrelenting cough with sleep disturbance with Tussionex with the warning that this agent is a narcotic.  She will continue on a collection of anti-inflammatory agents for her airway including use of anti- TSLP antibody which was working quite well until this exacerbation.  And she will continue to use omeprazole for her reflux.  Assuming she does well with this plan I will see her back in this clinic in 6 months or earlier if there is a problem.   Allena Katz, MD Allergy / Immunology [place]

## 2021-06-14 NOTE — Patient Instructions (Addendum)
  1.  Continue to treat this inflammatory flare up:   A. Flonase 1 spray each nostril 1-2 times per day  B. montelukast 10 mg tablet 1 time per day  C. Breztri - 2 inhalations 1-2 times per day with spacer   D. Tezepelumab injection every 4 weeks   2.  Continue to treat and prevent reflux:   A.  omeprazole 40 mg 1-2 times a day  3. If needed:      A. OTC antihistamine  B. OTC nasal saline   C. Proair HFA 2 inhalations every 4-6 hours  D. Mucinex DM - 2 tablets 2 times per day  4. For this recent episode:   A.  Depomedrol 80 mg IM delivered in clinic today  B.  Azithromycin 500 mg -1 tablet 1 time per day for 3 days only  C.  Tussionex -2.5-5.0 mL every 12 hours for cough. NARCOTIC. 60 mls  5. Return to clinic in 6 months or earlier if problem

## 2021-06-14 NOTE — Telephone Encounter (Signed)
Ashley Eaton called and said that starting Thursday after being outside off and on all day, she has been coughing up green clumps, wheezing, and has shortness of breath. She is using all of her meds as instructed by you and has been using her albuterol a couple times a day. Her PCP gave her a Prednisone taper pack on Friday but she is not any better since starting that. Please advise.

## 2021-06-15 ENCOUNTER — Encounter: Payer: Self-pay | Admitting: Allergy and Immunology

## 2021-06-24 ENCOUNTER — Ambulatory Visit: Payer: BC Managed Care – PPO

## 2021-06-29 ENCOUNTER — Ambulatory Visit (INDEPENDENT_AMBULATORY_CARE_PROVIDER_SITE_OTHER): Payer: BC Managed Care – PPO | Admitting: *Deleted

## 2021-06-29 DIAGNOSIS — J4550 Severe persistent asthma, uncomplicated: Secondary | ICD-10-CM | POA: Diagnosis not present

## 2021-07-02 ENCOUNTER — Ambulatory Visit: Payer: BC Managed Care – PPO | Admitting: Obstetrics & Gynecology

## 2021-07-12 ENCOUNTER — Ambulatory Visit: Payer: BC Managed Care – PPO | Admitting: Allergy and Immunology

## 2021-07-12 ENCOUNTER — Encounter: Payer: Self-pay | Admitting: Allergy and Immunology

## 2021-07-12 VITALS — BP 136/92 | HR 64 | Resp 14

## 2021-07-12 DIAGNOSIS — J4550 Severe persistent asthma, uncomplicated: Secondary | ICD-10-CM | POA: Diagnosis not present

## 2021-07-12 DIAGNOSIS — K219 Gastro-esophageal reflux disease without esophagitis: Secondary | ICD-10-CM | POA: Diagnosis not present

## 2021-07-12 DIAGNOSIS — J3089 Other allergic rhinitis: Secondary | ICD-10-CM

## 2021-07-12 NOTE — Progress Notes (Unsigned)
[facility] [facility] [place] [facility] [place]   Follow-up Note  Referring Provider: Ronita Hipps, MD Primary Provider: Ronita Hipps, MD Date of Office Visit: 07/12/2021  Subjective:   Ashley Eaton (DOB: Jul 06, 1960) is a 61 y.o. female who returns to the [facility] on 07/12/2021 in re-evaluation of the following:  HPI: Ashley Eaton returns to this clinic in evaluation of asthma, allergic rhinitis, and LPR.  Her last visit to this clinic was 14 June 2021.  During her last visit she had an acute respiratory tract flare most likely precipitated by a viral respiratory tract infection for which we had to treat her with systemic steroids and she resolved that issue.  Prior to that point in time she had really done very well regarding her asthma and her upper airway disease and her reflux induced respiratory disease as she continued to use appropriate therapy including the use of tezepelumab.  Currently, she can exert herself without any problem and does not use any short acting bronchodilator and has very little issues involving either her upper or lower airway and has no issues with reflux.  Allergies as of 07/12/2021       Reactions   Amoxicillin-pot Clavulanate Other (See Comments)   Hurts stomach   Other Other (See Comments)   Synthetic pain medication makes her heart race ?name   Prednisone Other (See Comments)   tablet form, upset stomach, states it makes her crazy        Medication List    albuterol 108 (90 Base) MCG/ACT inhaler Commonly known as: VENTOLIN HFA 2 puffs every 4-6 hours as needed   Breztri Aerosphere 160-9-4.8 MCG/ACT Aero Generic drug: Budeson-Glycopyrrol-Formoterol INHALE 2 PUFFS TWO TIMES DAILIY WITH SPACER   chlorpheniramine-HYDROcodone 10-8 MG/5ML Commonly known as: Tussionex Pennkinetic ER Can take 2.5 to 5.0 mL every 12 hours if needed for cough. NARCOTIC.   fluticasone 50 MCG/ACT nasal spray Commonly known as:  FLONASE USE ONE SPRAY IN EACH NOSTRIL TWICE DAILY AS DIRECTED   loratadine 10 MG tablet Commonly known as: CLARITIN TAKE 1 TABLET BY MOUTH EVERY DAY   METAMUCIL FIBER PO Take by mouth.   montelukast 10 MG tablet Commonly known as: SINGULAIR TAKE ONE TABLET ONCE DAILY AS DIRECTED   omeprazole 40 MG capsule Commonly known as: PRILOSEC Take 1 capsule 2 times daily   Tezspire 210 MG/1.91ML syringe Generic drug: tezepelumab-ekko INJECT 210 MG UNDER THE SKIN EVERY 4 WEEKS    Past Medical History:  Diagnosis Date   Asthma    Asthma    Cancer ([facility]) 06?   vulva   Complication of anesthesia    LPRD (laryngopharyngeal reflux disease)    Neuromuscular disorder (HCC)    Morton's neuroma, plantar fasciitis s/p 10   PONV (postoperative nausea and vomiting)     Past Surgical History:  Procedure Laterality Date   ABLATION SAPHENOUS VEIN W/ RFA Left 2021   CERVICAL CONIZATION W/BX  06   CHOLECYSTECTOMY N/A 10/15/2013   Procedure: LAPAROSCOPIC CHOLECYSTECTOMY WITH INTRAOPERATIVE CHOLANGIOGRAM;  Surgeon: Donnie Mesa, MD;  Location: [facility];  Service: General;  Laterality: N/A;   COLONOSCOPY  12/2014   with Dr. Collene Mares next one in 5 yrs   DIAGNOSTIC LAPAROSCOPY     endometriosis- 61 yrs old   FOOT SURGERY Bilateral 2010,2011   For Plantar Faciitis   FOOT SURGERY Bilateral 1992,95   For Morton's Neuroma    Review of systems negative except as noted in HPI /  PMHx or noted below:  Review of Systems  Constitutional: Negative.   HENT: Negative.    Eyes: Negative.   Respiratory: Negative.    Cardiovascular: Negative.   Gastrointestinal: Negative.   Genitourinary: Negative.   Musculoskeletal: Negative.   Skin: Negative.   Neurological: Negative.   Endo/Heme/Allergies: Negative.   Psychiatric/Behavioral: Negative.       Objective:   Vitals:   07/12/21 0826  BP: (!) 136/92  Pulse: 64  Resp: 14  SpO2: 97%          Physical Exam Constitutional:      Appearance: She is  not diaphoretic.  HENT:     Head: Normocephalic.     Right Ear: Tympanic membrane, ear canal and external ear normal.     Left Ear: Tympanic membrane, ear canal and external ear normal.     Nose: Nose normal. No mucosal edema or rhinorrhea.     Mouth/Throat:     Pharynx: Uvula midline. No oropharyngeal exudate.  Eyes:     Conjunctiva/sclera: Conjunctivae normal.  Neck:     Thyroid: No thyromegaly.     Trachea: Trachea normal. No tracheal tenderness or tracheal deviation.  Cardiovascular:     Rate and Rhythm: Normal rate and regular rhythm.     Heart sounds: Normal heart sounds, S1 normal and S2 normal. No murmur heard. Pulmonary:     Effort: No respiratory distress.     Breath sounds: Normal breath sounds. No stridor. No wheezing or rales.  Lymphadenopathy:     Head:     Right side of head: No tonsillar adenopathy.     Left side of head: No tonsillar adenopathy.     Cervical: No cervical adenopathy.  Skin:    Findings: No erythema or rash.     Nails: There is no clubbing.  Neurological:     Mental Status: She is alert.     Diagnostics: none  Assessment and Plan:   1. Asthma, severe persistent, well-controlled   2. Other allergic rhinitis   3. LPRD (laryngopharyngeal reflux disease)    1.  Continue to treat this inflammatory flare up:   A. Flonase 1 spray each nostril 1-2 times per day  B. montelukast 10 mg tablet 1 time per day  C. Breztri - 2 inhalations 1-2 times per day with spacer   D. Tezepelumab injection every 4 weeks   2.  Continue to treat and prevent reflux:   A.  omeprazole 40 mg 1-2 times a day  3. If needed:      A. OTC antihistamine  B. OTC nasal saline   C. Proair HFA 2 inhalations every 4-6 hours  D. Mucinex DM - 2 tablets 2 times per day  4. Obtain fall flu  5. Return to clinic in 6 months or earlier if problem  Ashley Eaton appears to be doing quite well.  She has a very good understanding of her disease state and how her medications work and  appropriate dosing of her medications depending on disease activity.  She will continue on a collection of anti-inflammatory agents for her airway including use of tezepelumab and continue to address the issue of reflux as noted above.  Assuming she does well with this plan I will see her back in this clinic in 6 months or earlier if there is a problem.   Allena Katz, MD Allergy / Immunology [place]

## 2021-07-12 NOTE — Patient Instructions (Signed)
  1.  Continue to treat this inflammatory flare up:   A. Flonase 1 spray each nostril 1-2 times per day  B. montelukast 10 mg tablet 1 time per day  C. Breztri - 2 inhalations 1-2 times per day with spacer   D. Tezepelumab injection every 4 weeks   2.  Continue to treat and prevent reflux:   A.  omeprazole 40 mg 1-2 times a day  3. If needed:      A. OTC antihistamine  B. OTC nasal saline   C. Proair HFA 2 inhalations every 4-6 hours  D. Mucinex DM - 2 tablets 2 times per day  4. Obtain fall flu  5. Return to clinic in 6 months or earlier if problem

## 2021-07-13 ENCOUNTER — Encounter: Payer: Self-pay | Admitting: Obstetrics & Gynecology

## 2021-07-13 ENCOUNTER — Ambulatory Visit (INDEPENDENT_AMBULATORY_CARE_PROVIDER_SITE_OTHER): Payer: BC Managed Care – PPO | Admitting: Obstetrics & Gynecology

## 2021-07-13 ENCOUNTER — Encounter: Payer: Self-pay | Admitting: Allergy and Immunology

## 2021-07-13 ENCOUNTER — Other Ambulatory Visit (HOSPITAL_COMMUNITY)
Admission: RE | Admit: 2021-07-13 | Discharge: 2021-07-13 | Disposition: A | Discharge status: Home / Self Care | Payer: BC Managed Care – PPO | Source: Ambulatory Visit | Attending: Obstetrics & Gynecology | Admitting: Obstetrics & Gynecology

## 2021-07-13 VITALS — BP 118/80 | HR 66 | Ht 61.25 in | Wt 181.0 lb

## 2021-07-13 DIAGNOSIS — Z01419 Encounter for gynecological examination (general) (routine) without abnormal findings: Secondary | ICD-10-CM | POA: Diagnosis present

## 2021-07-13 DIAGNOSIS — Z6833 Body mass index (BMI) 33.0-33.9, adult: Secondary | ICD-10-CM

## 2021-07-13 DIAGNOSIS — E6609 Other obesity due to excess calories: Secondary | ICD-10-CM | POA: Diagnosis not present

## 2021-07-13 DIAGNOSIS — Z780 Asymptomatic menopausal state: Secondary | ICD-10-CM

## 2021-07-13 NOTE — Progress Notes (Signed)
Ashley Eaton [birth year] [id number]   History:    61 y.o. H6P5F1M3  Married.  Ashley Eaton, husband with DM.  Children doing well.  Ashley Eaton is 76 yo, rising 10th grader.   RP:  Established patient presenting for annual gyn exam   HPI: Post menopause, well on no hormone replacement therapy.  No postmenopausal bleeding.  No pelvic pain.  No pain with intercourse.  Pap Neg 06/2018.  No h/o abnormal Pap.  Pap reflex today.  Urine and bowel movements normal.  Breasts normal.  Mammo 08/2020 Neg. Body mass index 33.92.  Healthy nutrition with vegetables.  Physically active with yard work especially.  Health labs with family physician.  [place] 12/2020. Health labs with Fam MD.   Past medical history,surgical history, family history and social history were all reviewed and documented in the EPIC chart.  Gynecologic History No LMP recorded. Patient is postmenopausal.  Obstetric History OB History  Gravida Para Term Preterm AB Living  '6 3     3 3  '$ SAB IAB Ectopic Multiple Live Births  1 2          # Outcome Date GA Lbr Len/2nd Weight Sex Delivery Anes PTL Lv  6 IAB           5 IAB           4 SAB           3 Para           2 Para           1 Para              ROS: A ROS was performed and pertinent positives and negatives are included in the history. GENERAL: No fevers or chills. HEENT: No change in vision, no earache, sore throat or sinus congestion. NECK: No pain or stiffness. CARDIOVASCULAR: No chest pain or pressure. No palpitations. PULMONARY: No shortness of breath, cough or wheeze. GASTROINTESTINAL: No abdominal pain, nausea, vomiting or diarrhea, melena or bright red blood per rectum. GENITOURINARY: No urinary frequency, urgency, hesitancy or dysuria. MUSCULOSKELETAL: No joint or muscle pain, no back pain, no recent trauma. DERMATOLOGIC: No rash, no itching, no lesions. ENDOCRINE: No polyuria, polydipsia, no heat or cold intolerance. No recent change in weight. HEMATOLOGICAL: No anemia or  easy bruising or bleeding. NEUROLOGIC: No headache, seizures, numbness, tingling or weakness. PSYCHIATRIC: No depression, no loss of interest in normal activity or change in sleep pattern.     Exam:   BP 118/80   Pulse 66   Ht 5' 1.25" (1.556 m)   Wt 181 lb (82.1 kg)   SpO2 97%   BMI 33.92 kg/m   Body mass index is 33.92 kg/m.  General appearance : Well developed well nourished female. No acute distress HEENT: Eyes: no retinal hemorrhage or exudates,  Neck supple, trachea midline, no carotid bruits, no thyroidmegaly Lungs: Clear to auscultation, no rhonchi or wheezes, or rib retractions  Heart: Regular rate and rhythm, no murmurs or gallops Breast:Examined in sitting and supine position were symmetrical in appearance, no palpable masses or tenderness,  no skin retraction, no nipple inversion, no nipple discharge, no skin discoloration, no axillary or supraclavicular lymphadenopathy Abdomen: no palpable masses or tenderness, no rebound or guarding Extremities: no edema or skin discoloration or tenderness  Pelvic: Vulva: Normal             Vagina: No gross lesions or discharge  Cervix: No gross lesions or discharge.  Pap  reflex done.  Uterus  AV, normal size, shape and consistency, non-tender and mobile  Adnexa  Without masses or tenderness  Anus: Normal   Assessment/Plan:  60 y.o. female for annual exam   1. Encounter for routine gynecological examination with Papanicolaou smear of cervix Post menopause, well on no hormone replacement therapy.  No postmenopausal bleeding.  No pelvic pain.  No pain with intercourse.  Pap Neg 06/2018.  No h/o abnormal Pap.  Pap reflex today.  Urine and bowel movements normal.  Breasts normal.  Mammo 08/2020 Neg. Body mass index 33.92.  Healthy nutrition with vegetables.  Physically active with yard work especially.  Health labs with family physician.  [place] 12/2020. Health labs with Fam MD. - Cytology - PAP( [facility])  2. Postmenopause Post  menopause, well on no hormone replacement therapy.  No postmenopausal bleeding.  No pelvic pain.  No pain with intercourse.  3. Class 1 obesity due to excess calories without serious comorbidity with body mass index (BMI) of 33.0 to 33.9 in adult  Body mass index 33.92.  Healthy nutrition with vegetables.  Recommend a lower calorie/carb diet. Physically active with yard work especially.   Ashley Bruins MD, 11:13 AM 07/13/2021

## 2021-07-15 LAB — CYTOLOGY - PAP: Diagnosis: NEGATIVE

## 2021-07-27 ENCOUNTER — Ambulatory Visit (INDEPENDENT_AMBULATORY_CARE_PROVIDER_SITE_OTHER): Payer: BC Managed Care – PPO | Admitting: *Deleted

## 2021-07-27 DIAGNOSIS — J4550 Severe persistent asthma, uncomplicated: Secondary | ICD-10-CM | POA: Diagnosis not present

## 2021-08-04 ENCOUNTER — Other Ambulatory Visit: Payer: Self-pay | Admitting: Allergy and Immunology

## 2021-08-24 ENCOUNTER — Ambulatory Visit (INDEPENDENT_AMBULATORY_CARE_PROVIDER_SITE_OTHER): Payer: BC Managed Care – PPO | Admitting: *Deleted

## 2021-08-24 DIAGNOSIS — J4550 Severe persistent asthma, uncomplicated: Secondary | ICD-10-CM | POA: Diagnosis not present

## 2021-09-21 ENCOUNTER — Ambulatory Visit (INDEPENDENT_AMBULATORY_CARE_PROVIDER_SITE_OTHER): Payer: BC Managed Care – PPO

## 2021-09-21 DIAGNOSIS — J4550 Severe persistent asthma, uncomplicated: Secondary | ICD-10-CM | POA: Diagnosis not present

## 2021-09-22 ENCOUNTER — Other Ambulatory Visit: Payer: Self-pay | Admitting: Allergy and Immunology

## 2021-10-19 ENCOUNTER — Ambulatory Visit (INDEPENDENT_AMBULATORY_CARE_PROVIDER_SITE_OTHER): Payer: BC Managed Care – PPO | Admitting: *Deleted

## 2021-10-19 DIAGNOSIS — J4550 Severe persistent asthma, uncomplicated: Secondary | ICD-10-CM

## 2021-10-24 ENCOUNTER — Other Ambulatory Visit: Payer: Self-pay | Admitting: Allergy and Immunology

## 2021-10-25 ENCOUNTER — Other Ambulatory Visit: Payer: Self-pay | Admitting: Allergy and Immunology

## 2021-10-25 ENCOUNTER — Telehealth: Payer: Self-pay | Admitting: Allergy and Immunology

## 2021-10-25 MED ORDER — BREZTRI AEROSPHERE 160-9-4.8 MCG/ACT IN AERO: INHALATION_SPRAY | RESPIRATORY_TRACT | 5 refills | Status: DC

## 2021-10-25 NOTE — Telephone Encounter (Signed)
Patient is requesting a refill on [facility]tri [facility] [place].

## 2021-10-25 NOTE — Telephone Encounter (Signed)
[facility] [place]

## 2021-11-16 ENCOUNTER — Ambulatory Visit (INDEPENDENT_AMBULATORY_CARE_PROVIDER_SITE_OTHER): Payer: BC Managed Care – PPO

## 2021-11-16 DIAGNOSIS — J4550 Severe persistent asthma, uncomplicated: Secondary | ICD-10-CM

## 2021-11-22 ENCOUNTER — Ambulatory Visit: Payer: BC Managed Care – PPO | Admitting: Allergy and Immunology

## 2021-11-22 ENCOUNTER — Encounter: Payer: Self-pay | Admitting: Allergy and Immunology

## 2021-11-22 VITALS — BP 136/82 | HR 83 | Resp 16

## 2021-11-22 DIAGNOSIS — J3089 Other allergic rhinitis: Secondary | ICD-10-CM

## 2021-11-22 DIAGNOSIS — J4551 Severe persistent asthma with (acute) exacerbation: Secondary | ICD-10-CM

## 2021-11-22 DIAGNOSIS — H6993 Unspecified Eustachian tube disorder, bilateral: Secondary | ICD-10-CM

## 2021-11-22 DIAGNOSIS — K219 Gastro-esophageal reflux disease without esophagitis: Secondary | ICD-10-CM

## 2021-11-22 MED ORDER — ALBUTEROL SULFATE HFA 108 (90 BASE) MCG/ACT IN AERS: INHALATION_SPRAY | RESPIRATORY_TRACT | 1 refills | Status: DC

## 2021-11-22 NOTE — Patient Instructions (Addendum)
  1.  Continue to treat this inflammatory flare up:   A. Flonase 1 spray each nostril 1-2 times per day  B. montelukast 10 mg tablet 1 time per day  C. Breztri - 2 inhalations 1-2 times per day with spacer   D. Tezepelumab injection every 4 weeks   2.  Continue to treat and prevent reflux:   A.  omeprazole 40 mg 1-2 times a day  3. If needed:      A. OTC antihistamine  B. OTC nasal saline   C. Proair HFA 2 inhalations every 4-6 hours  D. Mucinex DM - 2 tablets 2 times per day  4. For this episode:   A.  Prednisone 10 mg - 1 tablet 1 time per day for 10 days only  B.  Gentle equalization of ear pressure with nasal pinching inflation  5. Obtain fall flu when better  6. Return to clinic in 6 months or earlier if problem

## 2021-11-22 NOTE — Progress Notes (Signed)
[place] [facility]   Follow-up Note  Referring Provider: Ronita Hipps, MD Primary Provider: Ronita Hipps, MD Date of Office Visit: 11/22/2021  Subjective:   Ashley Eaton (DOB: [birth year]) is a 61 y.o. female who returns to the [facility] on 11/22/2021 in re-evaluation of the following:  HPI: [name] returns to this clinic in evaluation of asthma, allergic rhinitis, LPR.  Her last visit to this clinic was 12 July 2021.  Overall she was doing very well with her upper and lower airway and her throat and her reflux on a collection of anti-inflammatory agents for her airway and treatment directed against reflux.  She did not require systemic steroid or an infection since her last visit.  Unfortunately, yesterday she was exposed to leaf dust and she quickly developed coughing and throat clearing and now her right ear is full. She does not have any associated nasal symptoms or fever or ugly sputum production or chest pain.  She definitely has raspy voice with this event.  Allergies as of 11/22/2021       Reactions   Amoxicillin-pot Clavulanate Other (See Comments)   Hurts stomach   Other Other (See Comments)   Synthetic pain medication makes her heart race ?name   Prednisone Other (See Comments)   tablet form, upset stomach, states it makes her crazy        Medication List    albuterol 108 (90 Base) MCG/ACT inhaler Commonly known as: VENTOLIN HFA 2 puffs every 4-6 hours as needed   Breztri Aerosphere 160-9-4.8 MCG/ACT Aero Generic drug: Budeson-Glycopyrrol-Formoterol INHALE 2 PUFFS TWO TIMES DAILIY WITH SPACER   fluticasone 50 MCG/ACT nasal spray Commonly known as: FLONASE SPRAY 1 SPRAY INTO EACH NOSTRIL TWICE A DAY AS DIRECTED   loratadine 10 MG tablet Commonly known as: CLARITIN TAKE 1 TABLET BY MOUTH EVERY DAY   METAMUCIL FIBER PO Take by mouth.   montelukast 10 MG tablet Commonly known as:  SINGULAIR TAKE ONE TABLET ONCE DAILY AS DIRECTED   omeprazole 40 MG capsule Commonly known as: PRILOSEC Take 1 capsule 2 times daily   Tezspire 210 MG/1.91ML syringe Generic drug: tezepelumab-ekko INJECT 210 MG UNDER THE SKIN EVERY 4 WEEKS     Past Medical History:  Diagnosis Date   Asthma    Asthma    Cancer ([place]) 06?   vulva   Complication of anesthesia    LPRD (laryngopharyngeal reflux disease)    Neuromuscular disorder (HCC)    Morton's neuroma, plantar fasciitis s/p 10   PONV (postoperative nausea and vomiting)     Past Surgical History:  Procedure Laterality Date   ABLATION SAPHENOUS VEIN W/ RFA Left 2021   CERVICAL CONIZATION W/BX  06   CHOLECYSTECTOMY N/A 10/15/2013   Procedure: LAPAROSCOPIC CHOLECYSTECTOMY WITH INTRAOPERATIVE CHOLANGIOGRAM;  Surgeon: Donnie Mesa, MD;  Location: [facility];  Service: General;  Laterality: N/A;   COLONOSCOPY  12/2014   with Dr. Collene Mares next one in 5 yrs   DIAGNOSTIC LAPAROSCOPY     endometriosis- 61 yrs old   FOOT SURGERY Bilateral 2010,2011   For Plantar Faciitis   FOOT SURGERY Bilateral 1992,95   For Morton's Neuroma    Review of systems negative except as noted in HPI / PMHx or noted below:  Review of Systems  Constitutional: Negative.   HENT: Negative.    Eyes: Negative.   Respiratory: Negative.    Cardiovascular: Negative.   Gastrointestinal: Negative.   Genitourinary:  Negative.   Musculoskeletal: Negative.   Skin: Negative.   Neurological: Negative.   Endo/Heme/Allergies: Negative.   Psychiatric/Behavioral: Negative.       Objective:   Vitals:   11/22/21 1344  BP: 136/82  Pulse: 83  Resp: 16  SpO2: 98%          Physical Exam Constitutional:      Appearance: She is not diaphoretic.     Comments: Throat clearing, raspy voice  HENT:     Head: Normocephalic.     Right Ear: Ear canal and external ear normal.     Left Ear: Ear canal and external ear normal.     Ears:     Comments: Dull light reflex of  both tympanic membranes    Nose: Nose normal. No mucosal edema or rhinorrhea.     Mouth/Throat:     Pharynx: Uvula midline. No oropharyngeal exudate.  Eyes:     Conjunctiva/sclera: Conjunctivae normal.  Neck:     Thyroid: No thyromegaly.     Trachea: Trachea normal. No tracheal tenderness or tracheal deviation.  Cardiovascular:     Rate and Rhythm: Normal rate and regular rhythm.     Heart sounds: Normal heart sounds, S1 normal and S2 normal. No murmur heard. Pulmonary:     Effort: No respiratory distress.     Breath sounds: Normal breath sounds. No stridor. No wheezing or rales.  Lymphadenopathy:     Head:     Right side of head: No tonsillar adenopathy.     Left side of head: No tonsillar adenopathy.     Cervical: No cervical adenopathy.  Skin:    Findings: No erythema or rash.     Nails: There is no clubbing.  Neurological:     Mental Status: She is alert.     Diagnostics: none  Assessment and Plan:   1. Asthma, not well controlled, severe persistent, with acute exacerbation   2. Other allergic rhinitis   3. LPRD (laryngopharyngeal reflux disease)   4. Dysfunction of both eustachian tubes    1.  Continue to treat this inflammatory flare up:   A. Flonase 1 spray each nostril 1-2 times per day  B. montelukast 10 mg tablet 1 time per day  C. Breztri - 2 inhalations 1-2 times per day with spacer   D. Tezepelumab injection every 4 weeks   2.  Continue to treat and prevent reflux/LPR:   A.  omeprazole 40 mg 1-2 times a day  B.  Replace throat clearing with swallowing/drinking maneuver  3. If needed:      A. OTC antihistamine  B. OTC nasal saline   C. Proair HFA 2 inhalations every 4-6 hours  D. Mucinex DM - 2 tablets 2 times per day  4. For this episode:   A.  Prednisone 10 mg - 1 tablet 1 time per day for 10 days only  B.  Gentle equalization of ear pressure with nasal pinching inflation  5. Obtain fall flu when better  6. Return to clinic in 6 months or  earlier if problem  It is not entirely clear if [name] had an inflammatory exacerbation of her airway secondary to leaf particulate exposure or she now has a viral respiratory tract infection.  Whenever she develops any type of inflammatory flare she always precipitates her LPR and that appears to be the case today.  Will give her some anti-inflammatory agents above and beyond her usual therapy which does include the use of an anti-TSLP antibody  and we will make sure that she treats her LPR by using a proton pump inhibitor twice a day and minimizing any throat clearing.  I will see her back in this clinic in 6 months or earlier if there is a problem.   Allena Katz, MD Allergy / Immunology [place]

## 2021-11-24 ENCOUNTER — Other Ambulatory Visit: Payer: Self-pay | Admitting: Allergy and Immunology

## 2021-11-29 ENCOUNTER — Encounter: Payer: Self-pay | Admitting: Allergy and Immunology

## 2021-11-29 ENCOUNTER — Telehealth: Payer: Self-pay | Admitting: *Deleted

## 2021-11-29 NOTE — Telephone Encounter (Signed)
Informed of your message. She did not get tested, she was told because it's already been more than 6 days, it would not change the treatment. She was okay with letting it run it course. I did tell her to call us if she gets worse or does not get any better in the next several days.

## 2021-11-29 NOTE — Telephone Encounter (Signed)
Ashley Eaton went to Urgent Care and received a Kenalog injection and Cefdinir. They did a chest x-ray and diagnosed her with bronchitis. She said she is currently still wheezing, coughing, and is very congested. She wants to know if there is anything else you want her to do. Please advise.

## 2021-12-13 ENCOUNTER — Encounter: Payer: Self-pay | Admitting: Allergy and Immunology

## 2021-12-13 ENCOUNTER — Ambulatory Visit: Payer: BC Managed Care – PPO

## 2021-12-13 ENCOUNTER — Ambulatory Visit: Payer: BC Managed Care – PPO | Admitting: Allergy and Immunology

## 2021-12-13 VITALS — BP 128/82 | HR 88 | Resp 16

## 2021-12-13 DIAGNOSIS — H6993 Unspecified Eustachian tube disorder, bilateral: Secondary | ICD-10-CM | POA: Diagnosis not present

## 2021-12-13 DIAGNOSIS — J3089 Other allergic rhinitis: Secondary | ICD-10-CM | POA: Diagnosis not present

## 2021-12-13 DIAGNOSIS — K219 Gastro-esophageal reflux disease without esophagitis: Secondary | ICD-10-CM | POA: Diagnosis not present

## 2021-12-13 DIAGNOSIS — J4550 Severe persistent asthma, uncomplicated: Secondary | ICD-10-CM

## 2021-12-13 MED ORDER — FAMOTIDINE 40 MG PO TABS: 40.0000 mg | ORAL_TABLET | Freq: Every day | ORAL | 5 refills | Status: DC

## 2021-12-13 MED ORDER — AZITHROMYCIN 500 MG PO TABS: ORAL_TABLET | ORAL | 0 refills | Status: DC

## 2021-12-13 MED ORDER — HYDROCOD POLI-CHLORPHE POLI ER 10-8 MG/5ML PO SUER: ORAL | 0 refills | Status: DC

## 2021-12-13 NOTE — Progress Notes (Unsigned)
Ashley Eaton   Follow-up Note  Referring Provider: Ronita Hipps, MD Primary Provider: Ronita Hipps, MD Date of Office Visit: 12/13/2021  Subjective:   Ashley Eaton (DOB: 1960/02/15) is a 61 y.o. female who returns to the [name] on 12/13/2021 in re-evaluation of the following:  HPI: Roselina returns to this clinic in evaluation of asthma, allergic rhinitis, LPR.  Her last visit to this clinic was 22 November 2021.  During her last visit she appeared to have a viral induced respiratory tract infection that we treated with low-dose systemic steroids.  She ended up in the urgent care center within a week of that visit and was treated with a Kenalog injection and cefdinir and had a chest x-ray which apparently diagnosed "bronchitis".  She is really not much better.  She has lots of coughing and she is having spells of cough with posttussive emesis.  Some of the material she coughs up is yellow globs and clear globs.  She has developed laryngitis.  Her ear still feels full.  She does not have any anosmia or ugly nasal discharge or nasal stuffiness.  When she uses the short acting bronchodilator does not appear to help this issue.  Allergies as of 12/13/2021       Reactions   Amoxicillin-pot Clavulanate Other (See Comments)   Hurts stomach   Other Other (See Comments)   Synthetic pain medication makes her heart race ?name   Prednisone Other (See Comments)   tablet form, upset stomach, states it makes her crazy        Medication List    albuterol 108 (90 Base) MCG/ACT inhaler Commonly known as: VENTOLIN HFA 2 puffs every 4-6 hours as needed   Breztri Aerosphere 160-9-4.8 MCG/ACT Aero Generic drug: Budeson-Glycopyrrol-Formoterol INHALE 2 PUFFS TWO TIMES DAILIY WITH SPACER   fluticasone 50 MCG/ACT nasal spray Commonly known as: FLONASE SPRAY 1 SPRAY INTO EACH NOSTRIL TWICE A DAY AS DIRECTED   loratadine 10  MG tablet Commonly known as: CLARITIN TAKE 1 TABLET BY MOUTH EVERY DAY   METAMUCIL FIBER PO Take by mouth.   montelukast 10 MG tablet Commonly known as: SINGULAIR TAKE ONE TABLET ONCE DAILY AS DIRECTED   omeprazole 40 MG capsule Commonly known as: PRILOSEC TAKE 1 CAPSULE BY MOUTH TWICE A DAY   Tezspire 210 MG/1.91ML syringe Generic drug: tezepelumab-ekko INJECT 210 MG UNDER THE SKIN EVERY 4 WEEKS        Past Medical History:  Diagnosis Date   Asthma    Asthma    Cancer ([place]) 06?   vulva   Complication of anesthesia    LPRD (laryngopharyngeal reflux disease)    Neuromuscular disorder (HCC)    Morton's neuroma, plantar fasciitis s/p 10   PONV (postoperative nausea and vomiting)     Past Surgical History:  Procedure Laterality Date   ABLATION SAPHENOUS VEIN W/ RFA Left 2021   CERVICAL CONIZATION W/BX  06   CHOLECYSTECTOMY N/A 10/15/2013   Procedure: LAPAROSCOPIC CHOLECYSTECTOMY WITH INTRAOPERATIVE CHOLANGIOGRAM;  Surgeon: Donnie Mesa, MD;  Location: [facility];  Service: General;  Laterality: N/A;   COLONOSCOPY  12/2014   with Dr. Collene Mares next one in 5 yrs   DIAGNOSTIC LAPAROSCOPY     endometriosis- 61 yrs old   FOOT SURGERY Bilateral 2010,2011   For Plantar Faciitis   FOOT SURGERY Bilateral 1992,95   For Morton's Neuroma    Review of systems negative except as  noted in HPI / PMHx or noted below:  Review of Systems  Constitutional: Negative.   HENT: Negative.    Eyes: Negative.   Respiratory: Negative.    Cardiovascular: Negative.   Gastrointestinal: Negative.   Genitourinary: Negative.   Musculoskeletal: Negative.   Skin: Negative.   Neurological: Negative.   Endo/Heme/Allergies: Negative.   Psychiatric/Behavioral: Negative.       Objective:   Vitals:   12/13/21 1641  BP: 128/82  Pulse: 88  Resp: 16  SpO2: 97%          Physical Exam Constitutional:      Appearance: She is not diaphoretic.  HENT:     Head: Normocephalic.     Right Ear:  Tympanic membrane, ear canal and external ear normal.     Left Ear: Tympanic membrane, ear canal and external ear normal.     Nose: Nose normal. No mucosal edema or rhinorrhea.     Mouth/Throat:     Pharynx: Uvula midline. No oropharyngeal exudate.  Eyes:     Conjunctiva/sclera: Conjunctivae normal.  Neck:     Thyroid: No thyromegaly.     Trachea: Trachea normal. No tracheal tenderness or tracheal deviation.  Cardiovascular:     Rate and Rhythm: Normal rate and regular rhythm.     Heart sounds: Normal heart sounds, S1 normal and S2 normal. No murmur heard. Pulmonary:     Effort: No respiratory distress.     Breath sounds: No stridor. Wheezing (expiratory wheezing) present. No rales.  Lymphadenopathy:     Head:     Right side of head: No tonsillar adenopathy.     Left side of head: No tonsillar adenopathy.     Cervical: No cervical adenopathy.  Skin:    Findings: No erythema or rash.     Nails: There is no clubbing.  Neurological:     Mental Status: She is alert.     Diagnostics:    Spirometry was performed and demonstrated an FEV1 of 1.85 at 80 % of predicted.  The patient had an Asthma Control Test with the following results:  .    Assessment and Plan:   1. Not well controlled severe persistent asthma   2. Other allergic rhinitis   3. LPRD (laryngopharyngeal reflux disease)   4. Dysfunction of both eustachian tubes    1.  Continue to treat this inflammatory flare up:   A. Flonase 1 spray each nostril 1-2 times per day  B. montelukast 10 mg tablet 1 time per day  C. Breztri - 2 inhalations 1-2 times per day with spacer   D. Tezepelumab injection every 4 weeks  E. Prednisone 10 mg - 2 tabs daily for 10 days   2.  Continue to treat and prevent reflux/LPR:   A.  omeprazole 40 mg 2 times a day  B.  START famotidine 40 mg - 1 tablet in PM  B.  Replace throat clearing with swallowing/drinking maneuver  3. Treat mycoplasma infection:  A. Azithromycin 500 mg - 1  tablet 1 time a day for 3 days  4.  If needed:      A. OTC antihistamine  B. OTC nasal saline   C. Proair HFA 2 inhalations every 4-6 hours  D. Mucinex DM - 2 tablets 2 times per day  E. Gentle equalization of ear pressure with nasal pinching inflation  F. Tussionex -2.5 mL every 12 hours if needed for cough, NARCOTIC, 60 mls  5. Return to clinic in 4 weeks or  earlier if problem  Tashona has some form of inflammatory condition in her airway.  She has already been treated with broad-spectrum antibiotics but we will now give her a macrolide to cover possible lingering mycoplasma infection.  She will utilize another course of systemic steroids although not at relatively high dose and we will have her be a little bit more aggressive about treating her LPR as noted above.  She can use Tussionex if needed with the understanding that this is narcotic and the side effects that can occur with current narcotic use.  I will see her back in this clinic in 4 weeks or earlier if there is a problem.   Allena Katz, MD Allergy / Immunology [place]

## 2021-12-13 NOTE — Patient Instructions (Addendum)
  1.  Continue to treat this inflammatory flare up:   A. Flonase 1 spray each nostril 1-2 times per day  B. montelukast 10 mg tablet 1 time per day  C. Breztri - 2 inhalations 1-2 times per day with spacer   D. Tezepelumab injection every 4 weeks  E. Prednisone 10 mg - 2 tabs daily for 10 days   2.  Continue to treat and prevent reflux/LPR:   A.  omeprazole 40 mg 2 times a day  B.  START famotidine 40 mg - 1 tablet in PM  B.  Replace throat clearing with swallowing/drinking maneuver  3. Treat mycoplasma infection:  A. Azithromycin 500 mg - 1 tablet 1 time a day for 3 days  4.  If needed:      A. OTC antihistamine  B. OTC nasal saline   C. Proair HFA 2 inhalations every 4-6 hours  D. Mucinex DM - 2 tablets 2 times per day  E. Gentle equalization of ear pressure with nasal pinching inflation  F. Tussionex -2.5 mL every 12 hours if needed for cough, NARCOTIC, 60 mls  5. Return to clinic in 4 weeks or earlier if problem

## 2021-12-14 ENCOUNTER — Ambulatory Visit: Payer: BC Managed Care – PPO

## 2021-12-14 ENCOUNTER — Encounter: Payer: Self-pay | Admitting: Allergy and Immunology

## 2021-12-29 ENCOUNTER — Telehealth: Payer: Self-pay | Admitting: Allergy and Immunology

## 2021-12-29 NOTE — Telephone Encounter (Signed)
Informed of Dr. Bruna Potter message.

## 2021-12-29 NOTE — Telephone Encounter (Signed)
Patient tested positive for the flu on 12/21 and states Friday she was incapacitated. She is on her last day of Tamiflu but she is still dealing with a cough, yellow phlegm and wheezing.

## 2022-01-07 ENCOUNTER — Telehealth: Payer: Self-pay | Admitting: Allergy and Immunology

## 2022-01-07 MED ORDER — LORATADINE 10 MG PO TABS: 10.0000 mg | ORAL_TABLET | Freq: Every day | ORAL | 5 refills | Status: DC

## 2022-01-07 NOTE — Telephone Encounter (Signed)
Refill sent to the pharmacy on file

## 2022-01-07 NOTE — Telephone Encounter (Signed)
Patient is requesting refill for her loratadine.   [facility] [address] [id number]  Best contact number: [phone]

## 2022-01-10 ENCOUNTER — Ambulatory Visit (INDEPENDENT_AMBULATORY_CARE_PROVIDER_SITE_OTHER): Payer: BC Managed Care – PPO | Admitting: *Deleted

## 2022-01-10 DIAGNOSIS — J4550 Severe persistent asthma, uncomplicated: Secondary | ICD-10-CM

## 2022-02-07 ENCOUNTER — Ambulatory Visit (INDEPENDENT_AMBULATORY_CARE_PROVIDER_SITE_OTHER): Payer: BC Managed Care – PPO | Admitting: *Deleted

## 2022-02-07 DIAGNOSIS — J4550 Severe persistent asthma, uncomplicated: Secondary | ICD-10-CM | POA: Diagnosis not present

## 2022-03-07 ENCOUNTER — Ambulatory Visit (INDEPENDENT_AMBULATORY_CARE_PROVIDER_SITE_OTHER): Payer: BC Managed Care – PPO | Admitting: *Deleted

## 2022-03-07 DIAGNOSIS — J4550 Severe persistent asthma, uncomplicated: Secondary | ICD-10-CM

## 2022-03-30 ENCOUNTER — Other Ambulatory Visit: Payer: Self-pay | Admitting: Allergy and Immunology

## 2022-04-04 ENCOUNTER — Ambulatory Visit: Payer: BC Managed Care – PPO

## 2022-04-05 ENCOUNTER — Ambulatory Visit (INDEPENDENT_AMBULATORY_CARE_PROVIDER_SITE_OTHER): Payer: BC Managed Care – PPO

## 2022-04-05 DIAGNOSIS — J4550 Severe persistent asthma, uncomplicated: Secondary | ICD-10-CM | POA: Diagnosis not present

## 2022-04-14 IMAGING — DX DG HIP (WITH OR WITHOUT PELVIS) 2-3V*R*
3 series · 3 of 3 positions shown · non-contrast
Comparison: None.

CLINICAL DATA: Right hip pain, slipped and fell

EXAM:
DG HIP (WITH OR WITHOUT PELVIS) 2-3V RIGHT

[pelvis ap]
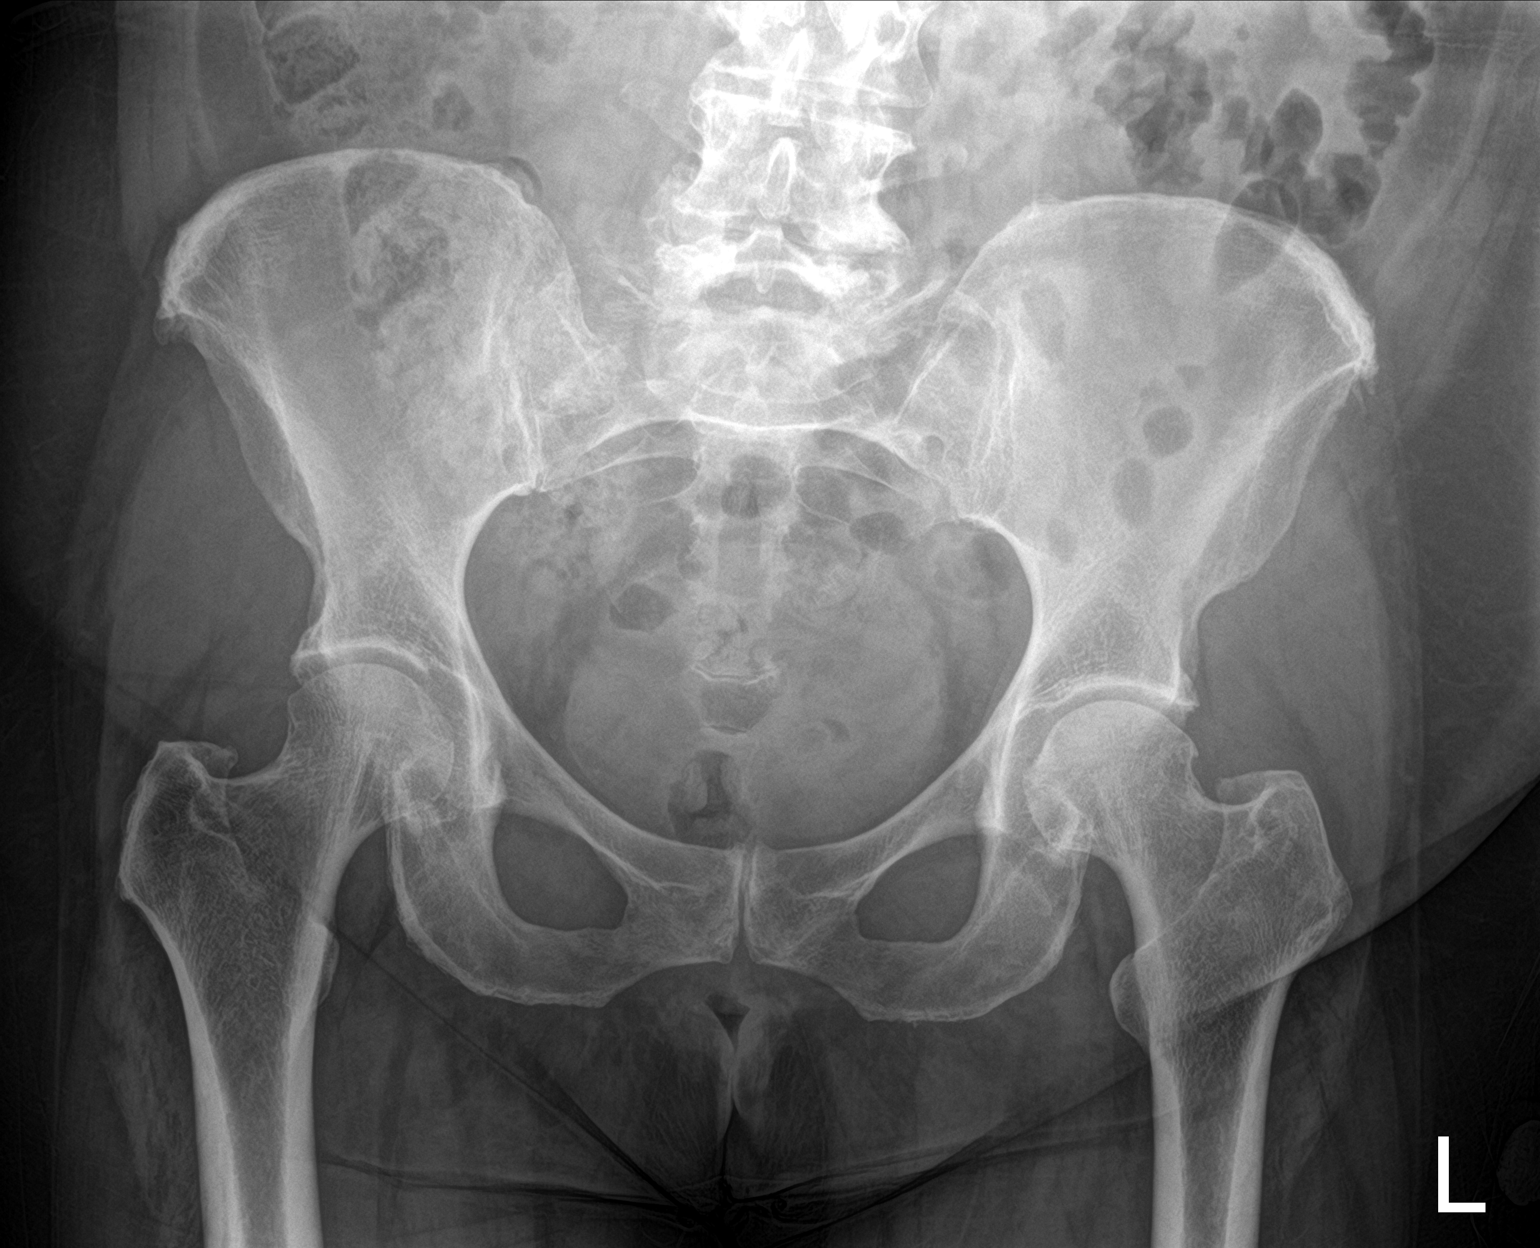

[hip ap]
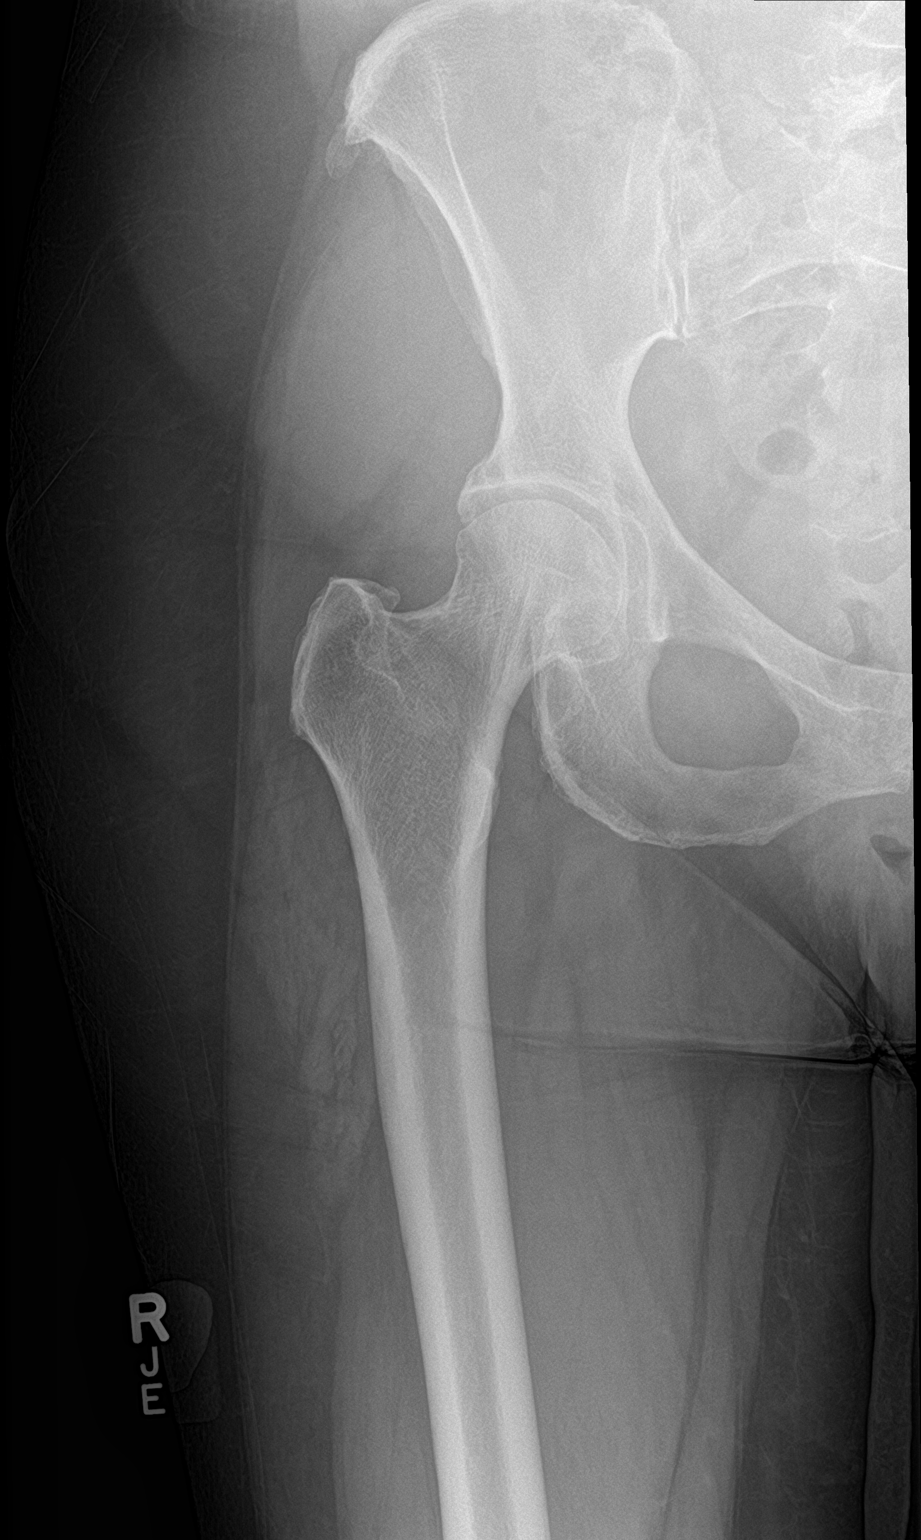

[hip lat]
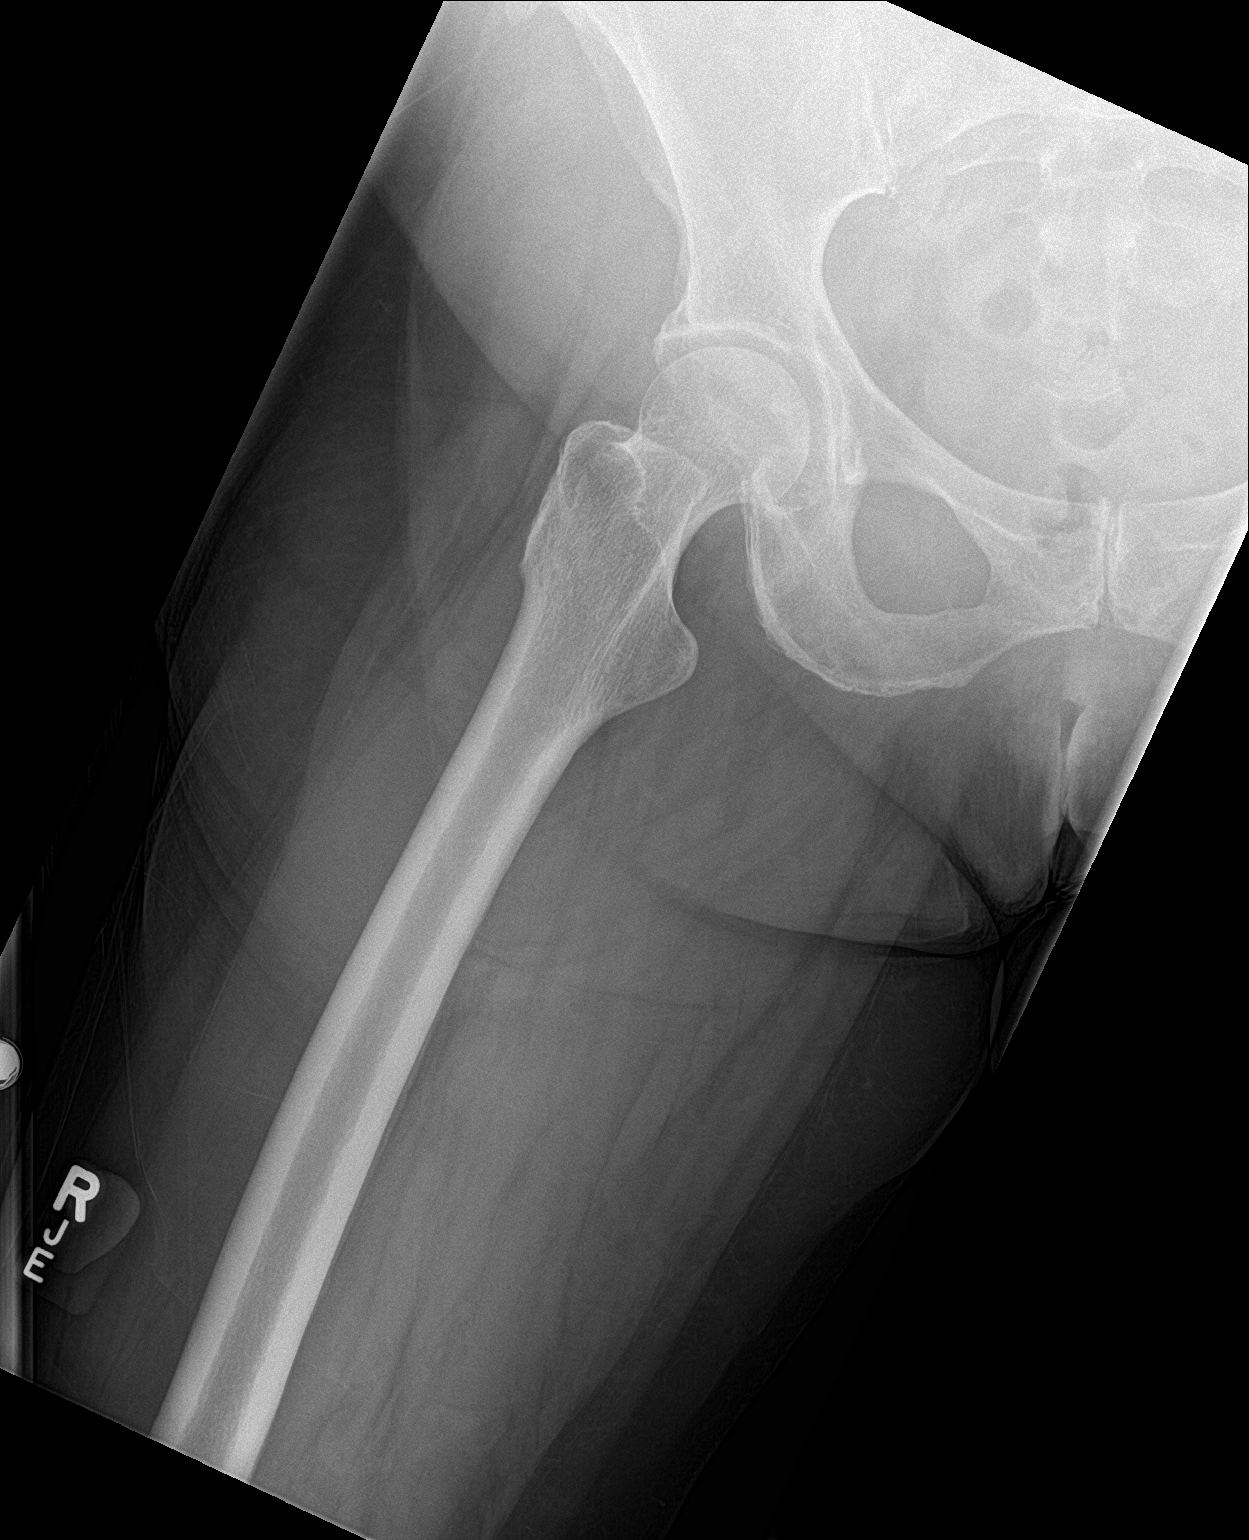

[3 of 3 positions shown; findings below may reference images not displayed]

FINDINGS: Frontal view of the pelvis as well as frontal and frogleg lateral
views of the right hip are obtained. No fracture, subluxation, or
dislocation. Joint spaces are well preserved. Sacroiliac joints are
normal.
IMPRESSION: 1. No acute displaced fracture.

## 2022-04-14 IMAGING — DX DG KNEE COMPLETE 4+V*R*
4 series · 4 of 4 positions shown · non-contrast
Comparison: None.

CLINICAL DATA: Slipped and fell, pain

EXAM:
RIGHT KNEE - COMPLETE 4+ VIEW

[knee ap]
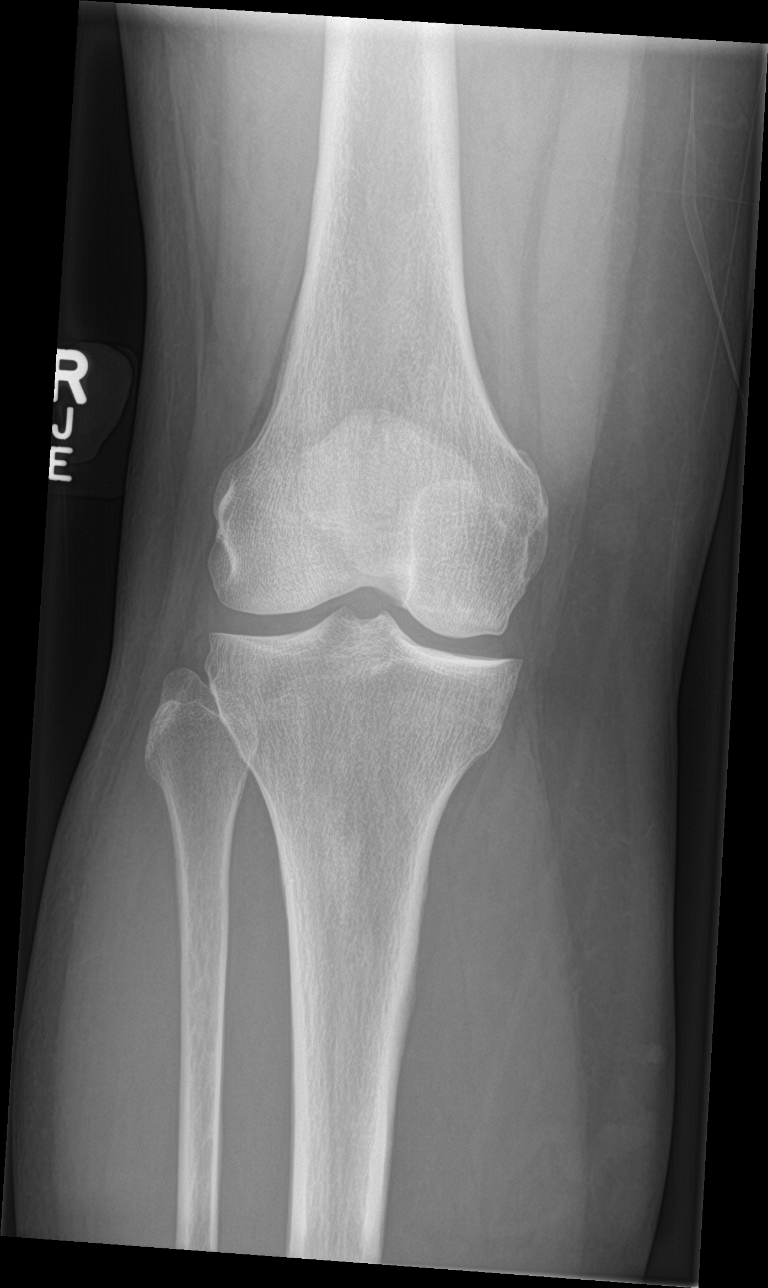

[knee lat]
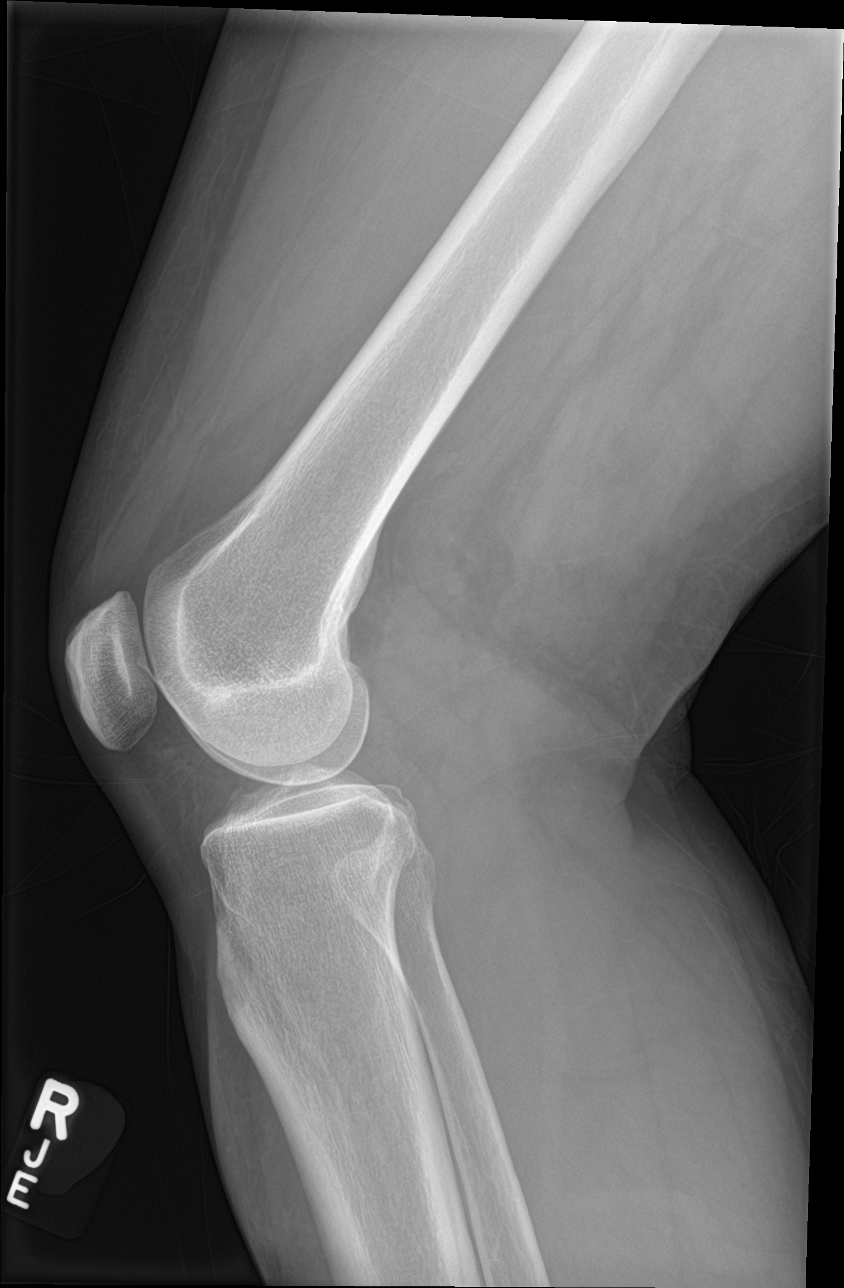

[knee obl (1 of 2)]
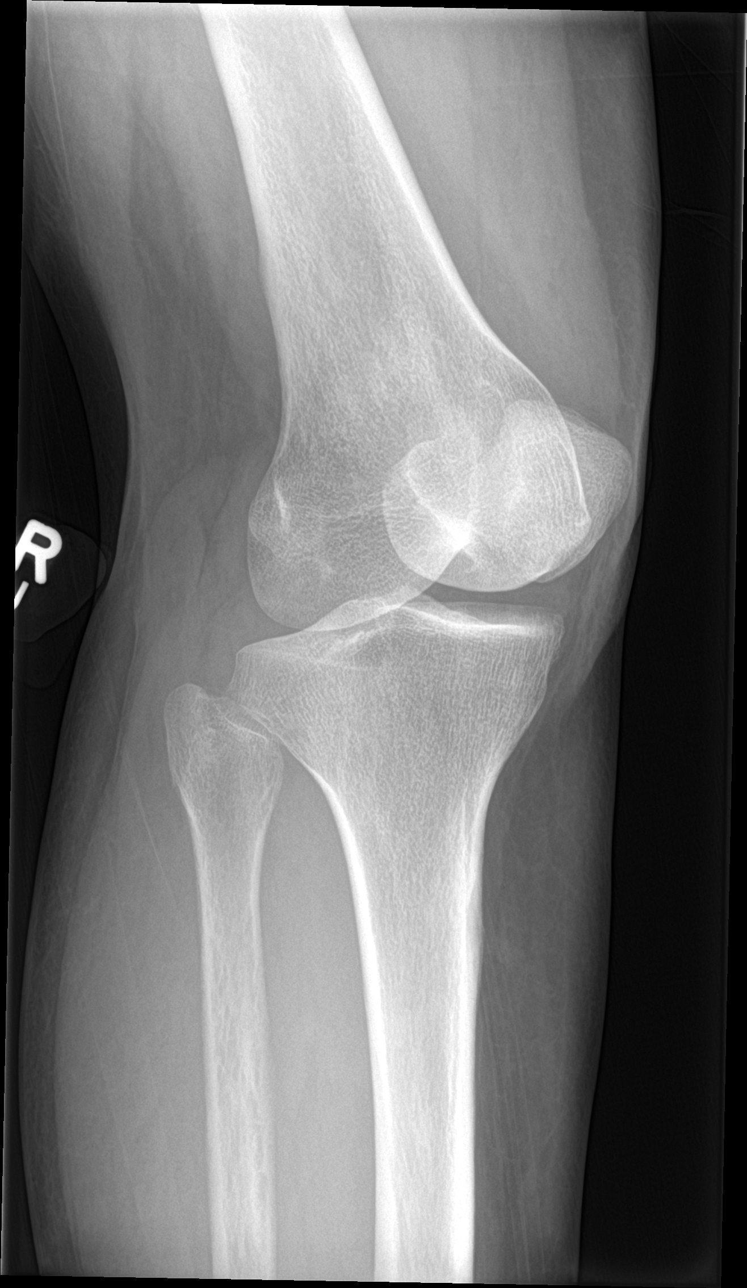

[knee obl (2 of 2)]
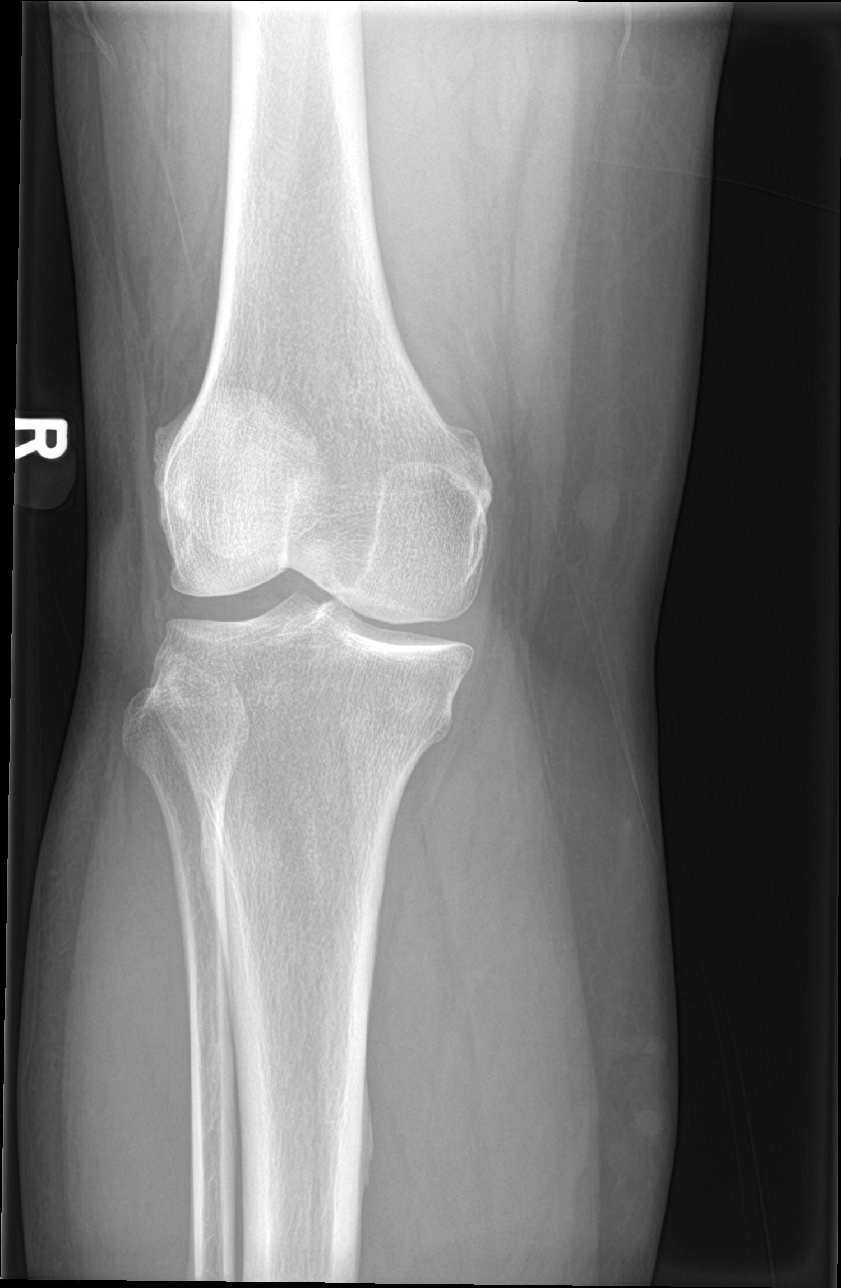

[4 of 4 positions shown; findings below may reference images not displayed]

FINDINGS: Frontal, bilateral oblique, lateral views of the right knee are
obtained. No fracture, subluxation, or dislocation. Joint spaces are
well preserved. No joint effusion. Soft tissues are unremarkable.
IMPRESSION: 1. Unremarkable right knee.

## 2022-04-21 ENCOUNTER — Other Ambulatory Visit: Payer: Self-pay | Admitting: Allergy and Immunology

## 2022-05-03 ENCOUNTER — Ambulatory Visit (INDEPENDENT_AMBULATORY_CARE_PROVIDER_SITE_OTHER): Payer: BC Managed Care – PPO | Admitting: *Deleted

## 2022-05-03 ENCOUNTER — Other Ambulatory Visit: Payer: Self-pay | Admitting: Allergy and Immunology

## 2022-05-03 DIAGNOSIS — J4550 Severe persistent asthma, uncomplicated: Secondary | ICD-10-CM

## 2022-05-09 ENCOUNTER — Other Ambulatory Visit: Payer: Self-pay | Admitting: Allergy and Immunology

## 2022-05-25 ENCOUNTER — Encounter: Payer: Self-pay | Admitting: Allergy and Immunology

## 2022-05-25 ENCOUNTER — Ambulatory Visit: Payer: BC Managed Care – PPO | Admitting: Allergy and Immunology

## 2022-05-25 VITALS — BP 112/82 | HR 68 | Resp 16

## 2022-05-25 DIAGNOSIS — J4550 Severe persistent asthma, uncomplicated: Secondary | ICD-10-CM

## 2022-05-25 DIAGNOSIS — J3089 Other allergic rhinitis: Secondary | ICD-10-CM

## 2022-05-25 DIAGNOSIS — K219 Gastro-esophageal reflux disease without esophagitis: Secondary | ICD-10-CM

## 2022-05-25 MED ORDER — FLUTICASONE PROPIONATE 50 MCG/ACT NA SUSP: NASAL | 5 refills | Status: DC

## 2022-05-25 MED ORDER — MONTELUKAST SODIUM 10 MG PO TABS: ORAL_TABLET | ORAL | 1 refills | Status: DC

## 2022-05-25 MED ORDER — OMEPRAZOLE 40 MG PO CPDR: DELAYED_RELEASE_CAPSULE | ORAL | 5 refills | Status: DC

## 2022-05-25 MED ORDER — LORATADINE 10 MG PO TABS: 10.0000 mg | ORAL_TABLET | Freq: Every day | ORAL | 5 refills | Status: DC

## 2022-05-25 MED ORDER — BREZTRI AEROSPHERE 160-9-4.8 MCG/ACT IN AERO: INHALATION_SPRAY | RESPIRATORY_TRACT | 5 refills | Status: DC

## 2022-05-25 MED ORDER — ALBUTEROL SULFATE HFA 108 (90 BASE) MCG/ACT IN AERS: INHALATION_SPRAY | RESPIRATORY_TRACT | 1 refills | Status: AC

## 2022-05-25 MED ORDER — FAMOTIDINE 40 MG PO TABS: 40.0000 mg | ORAL_TABLET | Freq: Every day | ORAL | 5 refills | Status: DC

## 2022-05-25 NOTE — Patient Instructions (Addendum)
  1.  Continue to treat this inflammatory flare up:   A. Flonase 1 spray each nostril 1-2 times per day  B. montelukast 10 mg tablet 1 time per day  C. Breztri - 2 inhalations 1-2 times per day with spacer   D. Tezepelumab injection every 4 weeks  2.  Continue to treat and prevent reflux/LPR:   A.  Omeprazole 40 mg 2 times a day  B.  Replace throat clearing with swallowing/drinking maneuver  3.  If needed:      A. OTC antihistamine  B. OTC nasal saline   C. Proair HFA 2 inhalations every 4-6 hours  D. Mucinex DM - 2 tablets 2 times per day  E. Gentle equalization of ear pressure with nasal pinching inflation   4. Return to clinic in 6 months or earlier if problem  5. Plan for fall flu vaccine

## 2022-05-25 NOTE — Progress Notes (Unsigned)
[place] - [place] - [place] - [place] - [place]   Follow-up Note  Referring Provider: Marylen Ponto, MD Primary Provider: Marylen Ponto, MD Date of Office Visit: 05/25/2022  Subjective:   Ashley Eaton (DOB: 1960-03-14) is a 62 y.o. female [facility] in re-evaluation of the following:  HPI: Ashley Eaton returns to this clinic in evaluation of asthma, allergic rhinitis, LPR.  I last saw her in this clinic 13 December 2021.  During the late winter she had a sustained issue tied up with cough and posttussive emesis and some sputum production and we ended up treating her with systemic steroids and antibiotics and she finally resolved that issue and ever since that point in time she has really done well with her airway and has not required a systemic steroid or an antibiotic and does not use a short acting bronchodilator and can exert herself without any problem while she continues on anti-TSLP antibody and a collection of anti-inflammatory agents for airway.  She has no problems with reflux.  When she used famotidine in the past it gave rise to muscle cramps.  She is now using omeprazole twice a day.  She did have strep last week that required the administration of clindamycin which fortunately resulted in resolution of her significant sore throat.  Allergies as of 05/25/2022       Reactions   Amoxicillin-pot Clavulanate Other (See Comments)   Hurts stomach   Other Other (See Comments)   Synthetic pain medication makes her heart race ?name   Prednisone Other (See Comments)   tablet form, upset stomach, states it makes her crazy        Medication List    albuterol 108 (90 Base) MCG/ACT inhaler Commonly known as: VENTOLIN HFA 2 puffs every 4-6 hours as needed   Breztri Aerosphere 160-9-4.8 MCG/ACT Aero Generic drug: Budeson-Glycopyrrol-Formoterol INHALE 2 PUFFS TWO TIMES DAILIY WITH SPACER   chlorpheniramine-HYDROcodone  10-8 MG/5ML Commonly known as: TUSSIONEX Can take 2.5 mL by mouth every 12 hours if needed for cough. NARCOTIC   clindamycin 300 MG capsule Commonly known as: CLEOCIN Take 300 mg by mouth 3 (three) times daily.   famotidine 40 MG tablet Commonly known as: PEPCID Take 1 tablet (40 mg total) by mouth at bedtime.   fluticasone 50 MCG/ACT nasal spray Commonly known as: FLONASE SPRAY 1 SPRAY INTO EACH NOSTRIL TWICE A DAY AS DIRECTED   loratadine 10 MG tablet Commonly known as: CLARITIN Take 1 tablet (10 mg total) by mouth daily.   METAMUCIL FIBER PO Take by mouth.   montelukast 10 MG tablet Commonly known as: SINGULAIR TAKE ONE TABLET ONCE DAILY AS DIRECTED   omeprazole 40 MG capsule Commonly known as: PRILOSEC TAKE 1 CAPSULE BY MOUTH TWICE A DAY   Tezspire 210 MG/1. syringe Generic drug: tezepelumab-ekko INJECT 210 MG UNDER THE SKIN EVERY 4 WEEKS    Past Medical History:  Diagnosis Date   Asthma    Asthma    Cancer (HCC) 06?   vulva   Complication of anesthesia    LPRD (laryngopharyngeal reflux disease)    Neuromuscular disorder (HCC)    Morton's neuroma, plantar fasciitis s/p 10   PONV (postoperative nausea and vomiting)     Past Surgical History:  Procedure Laterality Date   ABLATION SAPHENOUS VEIN W/ RFA Left 2021   CERVICAL CONIZATION W/BX  06   CHOLECYSTECTOMY N/A 10/15/2013   Procedure: LAPAROSCOPIC CHOLECYSTECTOMY WITH INTRAOPERATIVE CHOLANGIOGRAM;  Surgeon: Manus Rudd, MD;  Location: [facility] OR;  Service: General;  Laterality: N/A;   COLONOSCOPY  12/2014   with Dr. Loreta Ave next one in 5 yrs   DIAGNOSTIC LAPAROSCOPY     endometriosis- 62 yrs old   FOOT SURGERY Bilateral 2010,2011   For Plantar Faciitis   FOOT SURGERY Bilateral [phone]   For Morton's Neuroma    Review of systems negative except as noted in HPI / PMHx or noted below:  Review of Systems  Constitutional: Negative.   HENT: Negative.    Eyes: Negative.   Respiratory: Negative.     Cardiovascular: Negative.   Gastrointestinal: Negative.   Genitourinary: Negative.   Musculoskeletal: Negative.   Skin: Negative.   Neurological: Negative.   Endo/Heme/Allergies: Negative.   Psychiatric/Behavioral: Negative.       Objective:   Vitals:   05/25/22 1604  BP: 112/82  Pulse: 68  Resp: 16  SpO2: 96%          Physical Exam Constitutional:      Appearance: She is not diaphoretic.  HENT:     Head: Normocephalic.     Right Ear: Tympanic membrane, ear canal and external ear normal.     Left Ear: Tympanic membrane, ear canal and external ear normal.     Nose: Nose normal. No mucosal edema or rhinorrhea.     Mouth/Throat:     Pharynx: Uvula midline. No oropharyngeal exudate.  Eyes:     Conjunctiva/sclera: Conjunctivae normal.  Neck:     Thyroid: No thyromegaly.     Trachea: Trachea normal. No tracheal tenderness or tracheal deviation.  Cardiovascular:     Rate and Rhythm: Normal rate and regular rhythm.     Heart sounds: Normal heart sounds, S1 normal and S2 normal. No murmur heard. Pulmonary:     Effort: No respiratory distress.     Breath sounds: Normal breath sounds. No stridor. No wheezing or rales.  Lymphadenopathy:     Head:     Right side of head: No tonsillar adenopathy.     Left side of head: No tonsillar adenopathy.     Cervical: No cervical adenopathy.  Skin:    Findings: No erythema or rash.     Nails: There is no clubbing.  Neurological:     Mental Status: She is alert.     Diagnostics: Spirometry was performed and demonstrated an FEV1 of 2.20 at 95 % of predicted.  Assessment and Plan:   1. Asthma, severe persistent, well-controlled   2. Other allergic rhinitis   3. LPRD (laryngopharyngeal reflux disease)    1.  Continue to treat this inflammatory flare up:   A. Flonase 1 spray each nostril 1-2 times per day  B. montelukast 10 mg tablet 1 time per day  C. Breztri - 2 inhalations 1-2 times per day with spacer   D. Tezepelumab  injection every 4 weeks  2.  Continue to treat and prevent reflux/LPR:   A.  Omeprazole 40 mg 2 times a day  B.  Replace throat clearing with swallowing/drinking maneuver  3.  If needed:      A. OTC antihistamine  B. OTC nasal saline   C. Proair HFA 2 inhalations every 4-6 hours  D. Mucinex DM - 2 tablets 2 times per day  E. Gentle equalization of ear pressure with nasal pinching inflation   4. Return to clinic in 6 months or earlier if problem  5. Plan for fall flu vaccine  Ashley Eaton appears to be  doing very well at this point in time and she will continue on a collection of anti-inflammatory agents including use of anti-TSLP antibody and continue to address her LPR with omeprazole twice a day and I will see her back in this clinic in 6 months or earlier if there is a problem.  Laurette Schimke, MD Allergy / Immunology [facility]

## 2022-05-26 ENCOUNTER — Encounter: Payer: Self-pay | Admitting: Allergy and Immunology

## 2022-06-01 ENCOUNTER — Ambulatory Visit (INDEPENDENT_AMBULATORY_CARE_PROVIDER_SITE_OTHER): Payer: BC Managed Care – PPO

## 2022-06-01 DIAGNOSIS — J4550 Severe persistent asthma, uncomplicated: Secondary | ICD-10-CM

## 2022-06-29 ENCOUNTER — Ambulatory Visit (INDEPENDENT_AMBULATORY_CARE_PROVIDER_SITE_OTHER): Payer: BC Managed Care – PPO

## 2022-06-29 DIAGNOSIS — J4550 Severe persistent asthma, uncomplicated: Secondary | ICD-10-CM | POA: Diagnosis not present

## 2022-07-19 ENCOUNTER — Encounter: Payer: Self-pay | Admitting: Obstetrics & Gynecology

## 2022-07-19 ENCOUNTER — Ambulatory Visit (INDEPENDENT_AMBULATORY_CARE_PROVIDER_SITE_OTHER): Payer: BC Managed Care – PPO | Admitting: Obstetrics & Gynecology

## 2022-07-19 VITALS — BP 110/80 | HR 70 | Resp 16 | Ht 61.25 in | Wt 168.0 lb

## 2022-07-19 DIAGNOSIS — Z01419 Encounter for gynecological examination (general) (routine) without abnormal findings: Secondary | ICD-10-CM

## 2022-07-19 DIAGNOSIS — Z780 Asymptomatic menopausal state: Secondary | ICD-10-CM

## 2022-07-19 NOTE — Progress Notes (Signed)
Ashley Eaton 1960-05-07 [id number]   History:    62 y.o. [id number]  Married.  Harvie Heck, husband with DM.  Children doing well.  Zollie Scale is 38 yo, rising 11th grader.   RP:  Established patient presenting for annual gyn exam   HPI: Post menopause, well on no hormone replacement therapy.  No postmenopausal bleeding.  No pelvic pain.  No pain with intercourse. Pap Neg 07/2021.  No h/o abnormal Pap.  Repeat Pap at 3 years.  Urine and bowel movements normal.  Breasts normal.  Mammo 08/2020 Neg. Will send Mammo report from 2023 and schedule 2024. Body mass index 31.48.  Healthy nutrition with vegetables.  Physically active with yard work especially.  BD 07/2022.  Health labs with family physician.  Colono 12/2020. Health labs with Fam MD.   Past medical history,surgical history, family history and social history were all reviewed and documented in the EPIC chart.  Gynecologic History No LMP recorded. Patient is postmenopausal.  Obstetric History OB History  Gravida Para Term Preterm AB Living  6 3     3 3   SAB IAB Ectopic Multiple Live Births  1 2          # Outcome Date GA Lbr Len/2nd Weight Sex Type Anes PTL Lv  6 IAB           5 IAB           4 SAB           3 Para           2 Para           1 Para              ROS: A ROS was performed and pertinent positives and negatives are included in the history. GENERAL: No fevers or chills. HEENT: No change in vision, no earache, sore throat or sinus congestion. NECK: No pain or stiffness. CARDIOVASCULAR: No chest pain or pressure. No palpitations. PULMONARY: No shortness of breath, cough or wheeze. GASTROINTESTINAL: No abdominal pain, nausea, vomiting or diarrhea, melena or bright red blood per rectum. GENITOURINARY: No urinary frequency, urgency, hesitancy or dysuria. MUSCULOSKELETAL: No joint or muscle pain, no back pain, no recent trauma. DERMATOLOGIC: No rash, no itching, no lesions. ENDOCRINE: No polyuria, polydipsia, no heat or cold  intolerance. No recent change in weight. HEMATOLOGICAL: No anemia or easy bruising or bleeding. NEUROLOGIC: No headache, seizures, numbness, tingling or weakness. PSYCHIATRIC: No depression, no loss of interest in normal activity or change in sleep pattern.     Exam:   BP 110/80   Pulse 70   Resp 16   Ht 5' 1.25" (1.556 m)   Wt 168 lb (76.2 kg)   BMI 31.48 kg/m   Body mass index is 31.48 kg/m.  General appearance : Well developed well nourished female. No acute distress HEENT: Eyes: no retinal hemorrhage or exudates,  Neck supple, trachea midline, no carotid bruits, no thyroidmegaly Lungs: Clear to auscultation, no rhonchi or wheezes, or rib retractions  Heart: Regular rate and rhythm, no murmurs or gallops Breast:Examined in sitting and supine position were symmetrical in appearance, no palpable masses or tenderness,  no skin retraction, no nipple inversion, no nipple discharge, no skin discoloration, no axillary or supraclavicular lymphadenopathy Abdomen: no palpable masses or tenderness, no rebound or guarding Extremities: no edema or skin discoloration or tenderness  Pelvic: Vulva: Normal             Vagina: No gross  lesions or discharge  Cervix: No gross lesions or discharge  Uterus  AV, normal size, shape and consistency, non-tender and mobile  Adnexa  Without masses or tenderness  Anus: Normal   Assessment/Plan:  62 y.o. female for annual exam   1. Well female exam with routine gynecological exam Post menopause, well on no hormone replacement therapy.  No postmenopausal bleeding.  No pelvic pain.  No pain with intercourse. Pap Neg 07/2021.  No h/o abnormal Pap.  Repeat Pap at 3 years.  Urine and bowel movements normal.  Breasts normal.  Mammo 08/2020 Neg. Will send Mammo report from 2023 and schedule 2024. Body mass index 31.48.  Healthy nutrition with vegetables.  Physically active with yard work especially.  BD 07/2022.  Health labs with family physician.  [place]ono 12/2020.  Health labs with Fam MD.  2. Postmenopause  Post menopause, well on no hormone replacement therapy.  No postmenopausal bleeding.  No pelvic pain.  No pain with intercourse.   Genia Del MD, 11:10 AM

## 2022-07-27 ENCOUNTER — Ambulatory Visit (INDEPENDENT_AMBULATORY_CARE_PROVIDER_SITE_OTHER): Payer: BC Managed Care – PPO | Admitting: *Deleted

## 2022-07-27 DIAGNOSIS — J4550 Severe persistent asthma, uncomplicated: Secondary | ICD-10-CM | POA: Diagnosis not present

## 2022-08-24 ENCOUNTER — Ambulatory Visit (INDEPENDENT_AMBULATORY_CARE_PROVIDER_SITE_OTHER): Payer: BC Managed Care – PPO

## 2022-08-24 DIAGNOSIS — J4550 Severe persistent asthma, uncomplicated: Secondary | ICD-10-CM

## 2022-09-21 ENCOUNTER — Ambulatory Visit (INDEPENDENT_AMBULATORY_CARE_PROVIDER_SITE_OTHER): Payer: BC Managed Care – PPO | Admitting: *Deleted

## 2022-09-21 DIAGNOSIS — J4550 Severe persistent asthma, uncomplicated: Secondary | ICD-10-CM

## 2022-10-19 ENCOUNTER — Ambulatory Visit (INDEPENDENT_AMBULATORY_CARE_PROVIDER_SITE_OTHER): Payer: BC Managed Care – PPO | Admitting: *Deleted

## 2022-10-19 DIAGNOSIS — J4550 Severe persistent asthma, uncomplicated: Secondary | ICD-10-CM | POA: Diagnosis not present

## 2022-11-16 ENCOUNTER — Ambulatory Visit (INDEPENDENT_AMBULATORY_CARE_PROVIDER_SITE_OTHER): Payer: BC Managed Care – PPO | Admitting: *Deleted

## 2022-11-16 DIAGNOSIS — J4550 Severe persistent asthma, uncomplicated: Secondary | ICD-10-CM | POA: Diagnosis not present

## 2022-12-12 ENCOUNTER — Other Ambulatory Visit: Payer: Self-pay | Admitting: Allergy and Immunology

## 2022-12-14 ENCOUNTER — Ambulatory Visit (INDEPENDENT_AMBULATORY_CARE_PROVIDER_SITE_OTHER): Payer: BC Managed Care – PPO | Admitting: *Deleted

## 2022-12-14 DIAGNOSIS — J4550 Severe persistent asthma, uncomplicated: Secondary | ICD-10-CM | POA: Diagnosis not present

## 2023-01-05 ENCOUNTER — Other Ambulatory Visit: Payer: Self-pay | Admitting: Allergy and Immunology

## 2023-01-11 ENCOUNTER — Ambulatory Visit (INDEPENDENT_AMBULATORY_CARE_PROVIDER_SITE_OTHER): Payer: 59 | Admitting: *Deleted

## 2023-01-11 DIAGNOSIS — J4550 Severe persistent asthma, uncomplicated: Secondary | ICD-10-CM | POA: Diagnosis not present

## 2023-01-15 ENCOUNTER — Other Ambulatory Visit: Payer: Self-pay | Admitting: Allergy and Immunology

## 2023-02-08 ENCOUNTER — Ambulatory Visit: Payer: Medicaid Other

## 2023-02-08 DIAGNOSIS — J4550 Severe persistent asthma, uncomplicated: Secondary | ICD-10-CM | POA: Diagnosis not present

## 2023-02-16 ENCOUNTER — Ambulatory Visit: Payer: 59 | Admitting: Allergy and Immunology

## 2023-02-16 ENCOUNTER — Encounter: Payer: Self-pay | Admitting: Allergy and Immunology

## 2023-02-16 VITALS — BP 114/78 | HR 72 | Resp 16

## 2023-02-16 DIAGNOSIS — J3089 Other allergic rhinitis: Secondary | ICD-10-CM | POA: Diagnosis not present

## 2023-02-16 DIAGNOSIS — K219 Gastro-esophageal reflux disease without esophagitis: Secondary | ICD-10-CM

## 2023-02-16 DIAGNOSIS — H6993 Unspecified Eustachian tube disorder, bilateral: Secondary | ICD-10-CM

## 2023-02-16 DIAGNOSIS — J4550 Severe persistent asthma, uncomplicated: Secondary | ICD-10-CM | POA: Diagnosis not present

## 2023-02-16 MED ORDER — METHYLPREDNISOLONE ACETATE 80 MG/ML IJ SUSP
80.0000 mg | Freq: Once | INTRAMUSCULAR | Status: AC
  Administered 2023-02-16: 80 mg via INTRAMUSCULAR

## 2023-02-16 NOTE — Progress Notes (Signed)
[name] - [place] - [place] - [place] - [place]   Follow-up Note  Referring Provider: Marylen Ponto, MD Primary Provider: Marylen Ponto, MD Date of Office Visit: 02/16/2023  Subjective:   Ashley Eaton (DOB: 01-Dec-1960) is a 63 y.o. female who [facility]23 in re-evaluation of the following:  HPI: Ashley Eaton returns to this clinic in evaluation of asthma, allergic rhinitis, LPR.  I last saw her in this clinic 25 May 2022.  Recently she had a "pneumonia".  She had cough and shortness of breath and she felt bad in general and went to see her doctor and was diagnosed with a pneumonia and given 2 different types of antibiotics.  She is somewhat better yet still continues to have some cough and she still stuffy and still feels as though her head is full and now her ears are full.  Prior to that event she apparently did have some form of tonsillitis with what sounds like cervical adenopathy and a fever in October 2024 that required an antibiotic but fortunately that issue resolved.  Other than these 2 events she has done pretty well with her asthma and does not really use the short acting bronchodilator very often and has not required a systemic steroid to treat an exacerbation.  And her upper airway was doing relatively well up until these events occurred.  She believes that her reflux is under good control at this point in time.  She has had a flu vaccine and a COVID-vaccine.  Allergies as of 02/16/2023       Reactions   Amoxicillin-pot Clavulanate Other (See Comments)   Hurts stomach   Other Other (See Comments)   Synthetic pain medication makes her heart race ?name   Prednisone Other (See Comments)   tablet form, upset stomach, states it makes her crazy        Medication List    albuterol 108 (90 Base) MCG/ACT inhaler Commonly known as: VENTOLIN HFA 2 puffs every 4-6 hours as needed   Breztri Aerosphere 160-9-4.8 MCG/ACT  Aero Generic drug: Budeson-Glycopyrrol-Formoterol INHALE 2 PUFFS TWO TIMES DAILIY WITH SPACER   fluticasone 50 MCG/ACT nasal spray Commonly known as: FLONASE SPRAY 1 SPRAY INTO EACH NOSTRIL TWICE A DAY AS DIRECTED   loratadine 10 MG tablet Commonly known as: CLARITIN TAKE 1 TABLET BY MOUTH EVERY DAY   METAMUCIL FIBER PO Take by mouth.   montelukast 10 MG tablet Commonly known as: SINGULAIR TAKE ONE TABLET ONCE DAILY AS DIRECTED   omeprazole 40 MG capsule Commonly known as: PRILOSEC TAKE 1 CAPSULE BY MOUTH TWICE A DAY   Tezspire 210 MG/1. syringe Generic drug: tezepelumab-ekko INJECT 210 MG UNDER THE SKIN EVERY 4 WEEKS    Past Medical History:  Diagnosis Date   Asthma    Asthma    Cancer (HCC) 06?   vulva   Complication of anesthesia    LPRD (laryngopharyngeal reflux disease)    Neuromuscular disorder (HCC)    Morton's neuroma, plantar fasciitis s/p 10   PONV (postoperative nausea and vomiting)     Past Surgical History:  Procedure Laterality Date   ABLATION SAPHENOUS VEIN W/ RFA Left 2021   CERVICAL CONIZATION W/BX  06   CHOLECYSTECTOMY N/A 10/15/2013   Procedure: LAPAROSCOPIC CHOLECYSTECTOMY WITH INTRAOPERATIVE CHOLANGIOGRAM;  Surgeon: Manus Rudd, MD;  Location: [facility] OR;  Service: General;  Laterality: N/A;   COLONOSCOPY  12/2014   with Dr. Loreta Ave next one in 5 yrs  DIAGNOSTIC LAPAROSCOPY     endometriosis- 63 yrs old   FOOT SURGERY Bilateral 2010,2011   For Plantar Faciitis   FOOT SURGERY Bilateral [phone]   For Morton's Neuroma    Review of systems negative except as noted in HPI / PMHx or noted below:  Review of Systems  Constitutional: Negative.   HENT: Negative.    Eyes: Negative.   Respiratory: Negative.    Cardiovascular: Negative.   Gastrointestinal: Negative.   Genitourinary: Negative.   Musculoskeletal: Negative.   Skin: Negative.   Neurological: Negative.   Endo/Heme/Allergies: Negative.   Psychiatric/Behavioral: Negative.        Objective:   Vitals:   02/16/23 1553  BP: 114/78  Pulse: 72  Resp: 16  SpO2: 98%          Physical Exam Constitutional:      Appearance: She is not diaphoretic.  HENT:     Head: Normocephalic.     Right Ear: Ear canal and external ear normal. A middle ear effusion is present.     Left Ear: Ear canal and external ear normal. A middle ear effusion is present.     Nose: Nose normal. No mucosal edema or rhinorrhea.     Mouth/Throat:     Pharynx: Uvula midline. No oropharyngeal exudate.  Eyes:     Conjunctiva/sclera: Conjunctivae normal.  Neck:     Thyroid: No thyromegaly.     Trachea: Trachea normal. No tracheal tenderness or tracheal deviation.  Cardiovascular:     Rate and Rhythm: Normal rate and regular rhythm.     Heart sounds: Normal heart sounds, S1 normal and S2 normal. No murmur heard. Pulmonary:     Effort: No respiratory distress.     Breath sounds: Normal breath sounds. No stridor. No wheezing or rales.  Lymphadenopathy:     Head:     Right side of head: No tonsillar adenopathy.     Left side of head: No tonsillar adenopathy.     Cervical: No cervical adenopathy.  Skin:    Findings: No erythema or rash.     Nails: There is no clubbing.  Neurological:     Mental Status: She is alert.     Diagnostics: Spirometry was performed and demonstrated an FEV1 of 1.73 at 76 % of predicted.  Assessment and Plan:   1. Not well controlled severe persistent asthma   2. Other allergic rhinitis   3. LPRD (laryngopharyngeal reflux disease)   4. Dysfunction of both eustachian tubes     1.  Continue to treat this inflammatory flare up:   A. Flonase 1 spray each nostril 1-2 times per day  B. montelukast 10 mg tablet 1 time per day  C. Breztri - 2 inhalations 1-2 times per day with spacer   D. Tezepelumab injection every 4 weeks  2.  Continue to treat and prevent reflux/LPR:   A.  Omeprazole 40 mg - 2 times a day  B.  Replace throat clearing with  swallowing/drinking maneuver  3.  If needed:      A. OTC antihistamine  B. OTC nasal saline   C. Proair HFA 2 inhalations every 4-6 hours  D. Mucinex DM - 2 tablets 2 times per day  E. Gentle equalization of ear pressure with nasal pinching inflation   4. For this recent event:   A. Depomedrol 80 mg IM delivered in clinic today  5. Return to clinic in 6 months or earlier if problem  6. Influenza = Tamiflu. Covid =  Paxlovid  I am going to give Tresa Endo a systemic steroid to address the inflammation that appears to have occurred as a result of a an infectious disease she contracted several weeks ago.  And she will continue to use a collection of anti-inflammatory agents for her airway including anti-TSLP antibody and continue to address her issue with LPR using a proton pump inhibitor.  She once again has developed ETD and she can use positive pressure inflation of her eustachian tubes to help with that issue.  Laurette Schimke, MD Allergy / Immunology [facility]

## 2023-02-16 NOTE — Patient Instructions (Addendum)
  1.  Continue to treat this inflammatory flare up:   A. Flonase 1 spray each nostril 1-2 times per day  B. montelukast 10 mg tablet 1 time per day  C. Breztri - 2 inhalations 1-2 times per day with spacer   D. Tezepelumab injection every 4 weeks  2.  Continue to treat and prevent reflux/LPR:   A.  Omeprazole 40 mg - 2 times a day  B.  Replace throat clearing with swallowing/drinking maneuver  3.  If needed:      A. OTC antihistamine  B. OTC nasal saline   C. Proair HFA 2 inhalations every 4-6 hours  D. Mucinex DM - 2 tablets 2 times per day  E. Gentle equalization of ear pressure with nasal pinching inflation   4. For this recent event:   A. Depomedrol 80 mg IM delivered in clinic today  5. Return to clinic in 6 months or earlier if problem  6. Influenza = Tamiflu. Covid = Paxlovid

## 2023-02-20 ENCOUNTER — Encounter: Payer: Self-pay | Admitting: Allergy and Immunology

## 2023-03-08 ENCOUNTER — Ambulatory Visit: Payer: 59

## 2023-03-08 DIAGNOSIS — J4550 Severe persistent asthma, uncomplicated: Secondary | ICD-10-CM

## 2023-04-05 ENCOUNTER — Ambulatory Visit

## 2023-04-05 DIAGNOSIS — J4550 Severe persistent asthma, uncomplicated: Secondary | ICD-10-CM

## 2023-04-10 ENCOUNTER — Other Ambulatory Visit: Payer: Self-pay | Admitting: Allergy and Immunology

## 2023-04-17 ENCOUNTER — Encounter: Payer: Self-pay | Admitting: Allergy and Immunology

## 2023-04-17 ENCOUNTER — Ambulatory Visit: Admitting: Allergy and Immunology

## 2023-04-17 VITALS — BP 118/76 | HR 96 | Resp 16

## 2023-04-17 DIAGNOSIS — J3089 Other allergic rhinitis: Secondary | ICD-10-CM

## 2023-04-17 DIAGNOSIS — H6993 Unspecified Eustachian tube disorder, bilateral: Secondary | ICD-10-CM | POA: Diagnosis not present

## 2023-04-17 DIAGNOSIS — J988 Other specified respiratory disorders: Secondary | ICD-10-CM

## 2023-04-17 DIAGNOSIS — J4550 Severe persistent asthma, uncomplicated: Secondary | ICD-10-CM | POA: Diagnosis not present

## 2023-04-17 DIAGNOSIS — K219 Gastro-esophageal reflux disease without esophagitis: Secondary | ICD-10-CM

## 2023-04-17 DIAGNOSIS — B9789 Other viral agents as the cause of diseases classified elsewhere: Secondary | ICD-10-CM

## 2023-04-17 NOTE — Patient Instructions (Addendum)
  1.  Continue to treat this inflammatory flare up:   A. Flonase 1 spray each nostril 1-2 times per day  B. montelukast 10 mg tablet 1 time per day  C. Breztri - 2 inhalations 1-2 times per day with spacer   D. Tezepelumab injection every 4 weeks  2.  Continue to treat and prevent reflux/LPR:   A.  Omeprazole 40 mg - 2 times a day  B.  Replace throat clearing with swallowing/drinking maneuver  3.  If needed:      A. OTC antihistamine  B. OTC nasal saline   C. Proair HFA 2 inhalations every 4-6 hours  D. Mucinex DM - 2 tablets 2 times per day  E. Gentle equalization of ear pressure with nasal pinching inflation   4. For this recent event:   A. Prednisone 10 mg - 1 tablet 1 time per day for 7 days  5. Return to clinic in 6 months or earlier if problem  6. Influenza = Tamiflu. Covid = Paxlovid

## 2023-04-17 NOTE — Progress Notes (Unsigned)
 [place] - [place] - [place] - [place] - [place]   Follow-up Note  Referring Provider: Marylen Ponto, MD Primary Provider: Marylen Ponto, MD Date of Office Visit: 04/17/2023  Subjective:   Ashley Eaton (DOB: May 27, 1960) is a 63 y.o. female who [facility] in re-evaluation of the following:  HPI: Ashley Eaton returns to this clinic in evaluation of asthma, allergic rhinitis, LPR.  I last saw her in this clinic 16 February 2023.  During her last visit she appeared to be suffering from lingering viral infection of her respiratory tract and we gave her a systemic steroid to clear up the inflammation that involved her head and eustachian tubes predominantly.  She completely cleared up from that last episode and was doing very well but 3 days ago she had acute onset of cough and this is progressed to barking along with some slight stuffiness of her nose and headache and just feeling fatigued and her ears hurt.  She has not had any fever or loss of smell or taste or ugly nasal discharge or sputum production or chest pain but she has had some chest tightness.  Allergies as of 04/17/2023       Reactions   Amoxicillin-pot Clavulanate Other (See Comments)   Hurts stomach   Other Other (See Comments)   Synthetic pain medication makes her heart race ?name   Prednisone Other (See Comments)   tablet form, upset stomach, states it makes her crazy        Medication List    albuterol 108 (90 Base) MCG/ACT inhaler Commonly known as: VENTOLIN HFA 2 puffs every 4-6 hours as needed   Breztri Aerosphere 160-9-4.8 MCG/ACT Aero inhaler Generic drug: budeson-glycopyrrolate-formoterol INHALE 2 PUFFS TWO TIMES DAILIY WITH SPACER   fluticasone 50 MCG/ACT nasal spray Commonly known as: FLONASE SPRAY 1 SPRAY INTO EACH NOSTRIL TWICE A DAY AS DIRECTED   loratadine 10 MG tablet Commonly known as: CLARITIN TAKE 1 TABLET BY MOUTH EVERY DAY    METAMUCIL FIBER PO Take by mouth.   montelukast 10 MG tablet Commonly known as: SINGULAIR TAKE ONE TABLET ONCE DAILY AS DIRECTED   omeprazole 40 MG capsule Commonly known as: PRILOSEC TAKE 1 CAPSULE BY MOUTH TWICE A DAY   Tezspire 210 MG/1. syringe Generic drug: tezepelumab-ekko INJECT 1 SYRINGE UNDER THE SKIN EVERY 28 DAYS    Past Medical History:  Diagnosis Date   Asthma    Asthma    Cancer (HCC) 06?   vulva   Complication of anesthesia    LPRD (laryngopharyngeal reflux disease)    Neuromuscular disorder (HCC)    Morton's neuroma, plantar fasciitis s/p 10   PONV (postoperative nausea and vomiting)     Past Surgical History:  Procedure Laterality Date   ABLATION SAPHENOUS VEIN W/ RFA Left 2021   CERVICAL CONIZATION W/BX  06   CHOLECYSTECTOMY N/A 10/15/2013   Procedure: LAPAROSCOPIC CHOLECYSTECTOMY WITH INTRAOPERATIVE CHOLANGIOGRAM;  Surgeon: Manus Rudd, MD;  Location: [facility] OR;  Service: General;  Laterality: N/A;   COLONOSCOPY  12/2014   with Dr. Loreta Ave next one in 5 yrs   DIAGNOSTIC LAPAROSCOPY     endometriosis- 63 yrs old   FOOT SURGERY Bilateral 2010,2011   For Plantar Faciitis   FOOT SURGERY Bilateral 1992,95   For Morton's Neuroma    Review of systems negative except as noted in HPI / PMHx or noted below:  Review of Systems  Constitutional: Negative.   HENT: Negative.  Eyes: Negative.   Respiratory: Negative.    Cardiovascular: Negative.   Gastrointestinal: Negative.   Genitourinary: Negative.   Musculoskeletal: Negative.   Skin: Negative.   Neurological: Negative.   Endo/Heme/Allergies: Negative.   Psychiatric/Behavioral: Negative.       Objective:   Vitals:   04/17/23 1558  BP: 118/76  Pulse: 96  Resp: 16  SpO2: 98%          Physical Exam Constitutional:      Appearance: She is not diaphoretic.  HENT:     Head: Normocephalic.     Right Ear: Ear canal and external ear normal. A middle ear effusion is present.     Left  Ear: Ear canal and external ear normal. A middle ear effusion is present.     Nose: Nose normal. No mucosal edema or rhinorrhea.     Mouth/Throat:     Pharynx: Uvula midline. No oropharyngeal exudate.  Eyes:     Conjunctiva/sclera: Conjunctivae normal.  Neck:     Thyroid: No thyromegaly.     Trachea: Trachea normal. No tracheal tenderness or tracheal deviation.  Cardiovascular:     Rate and Rhythm: Normal rate and regular rhythm.     Heart sounds: Normal heart sounds, S1 normal and S2 normal. No murmur heard. Pulmonary:     Effort: No respiratory distress.     Breath sounds: Normal breath sounds. No stridor. No wheezing or rales.  Lymphadenopathy:     Head:     Right side of head: No tonsillar adenopathy.     Left side of head: No tonsillar adenopathy.     Cervical: No cervical adenopathy.  Skin:    Findings: No erythema or rash.     Nails: There is no clubbing.  Neurological:     Mental Status: She is alert.     Diagnostics: Spirometry was performed and demonstrated an FEV1 of 1.97 at 86 % of predicted.  Assessment and Plan:   1. Not well controlled severe persistent asthma   2. Dysfunction of both eustachian tubes   3. Viral respiratory illness   4. Other allergic rhinitis   5. LPRD (laryngopharyngeal reflux disease)    1.  Continue to treat this inflammatory flare up:   A. Flonase 1 spray each nostril 1-2 times per day  B. montelukast 10 mg tablet 1 time per day  C. Breztri - 2 inhalations 1-2 times per day with spacer   D. Tezepelumab injection every 4 weeks  2.  Continue to treat and prevent reflux/LPR:   A.  Omeprazole 40 mg - 2 times a day  B.  Replace throat clearing with swallowing/drinking maneuver  3.  If needed:      A. OTC antihistamine  B. OTC nasal saline   C. Proair HFA 2 inhalations every 4-6 hours  D. Mucinex DM - 2 tablets 2 times per day  E. Gentle equalization of ear pressure with nasal pinching inflation   4. For this recent  event:   A. Prednisone 10 mg - 1 tablet 1 time per day for 7 days  5. Return to clinic in 6 months or earlier if problem  6. Influenza = Tamiflu. Covid = Paxlovid  [name] appears to have another viral respiratory tract infection that is giving rise to irritation of her airway including significant eustachian tube dysfunction and I am going to treat her with a low-dose of systemic steroids for a few days and see what happens.  Hopefully this will not rollover  into the unrelenting coughing that sometimes develops with previous viral infections.  Schuyler Custard, MD Allergy / Immunology [facility]

## 2023-04-18 ENCOUNTER — Encounter: Payer: Self-pay | Admitting: Allergy and Immunology

## 2023-04-22 ENCOUNTER — Other Ambulatory Visit: Payer: Self-pay | Admitting: Allergy and Immunology

## 2023-04-24 ENCOUNTER — Telehealth: Payer: Self-pay | Admitting: Allergy and Immunology

## 2023-04-24 MED ORDER — PREDNISONE 10 MG PO TABS: ORAL_TABLET | ORAL | 0 refills | Status: DC

## 2023-04-24 NOTE — Telephone Encounter (Signed)
 Patient states she finished Prednisone  yesterday. She is feeling a little better since her OV but she is still dealing with the hacking cough and drainage. She could not sleep last night due to the constant cough attacks. She would like to know if there is anything else Dr. Jerelene Monday suggests her to do.

## 2023-04-24 NOTE — Telephone Encounter (Signed)
 Patient informed of Dr. Zenia Hight message.  ERX sent to [facility].

## 2023-05-03 ENCOUNTER — Ambulatory Visit (INDEPENDENT_AMBULATORY_CARE_PROVIDER_SITE_OTHER)

## 2023-05-03 ENCOUNTER — Ambulatory Visit

## 2023-05-03 DIAGNOSIS — J4550 Severe persistent asthma, uncomplicated: Secondary | ICD-10-CM | POA: Diagnosis not present

## 2023-05-31 ENCOUNTER — Ambulatory Visit (INDEPENDENT_AMBULATORY_CARE_PROVIDER_SITE_OTHER)

## 2023-05-31 DIAGNOSIS — J4550 Severe persistent asthma, uncomplicated: Secondary | ICD-10-CM | POA: Diagnosis not present

## 2023-06-28 ENCOUNTER — Ambulatory Visit (INDEPENDENT_AMBULATORY_CARE_PROVIDER_SITE_OTHER)

## 2023-06-28 DIAGNOSIS — J4550 Severe persistent asthma, uncomplicated: Secondary | ICD-10-CM

## 2023-07-19 ENCOUNTER — Other Ambulatory Visit: Payer: Self-pay | Admitting: Allergy and Immunology

## 2023-07-26 ENCOUNTER — Other Ambulatory Visit: Payer: Self-pay | Admitting: Allergy and Immunology

## 2023-07-26 ENCOUNTER — Ambulatory Visit

## 2023-07-26 DIAGNOSIS — J4550 Severe persistent asthma, uncomplicated: Secondary | ICD-10-CM | POA: Diagnosis not present

## 2023-08-21 ENCOUNTER — Other Ambulatory Visit: Payer: Self-pay | Admitting: *Deleted

## 2023-08-21 NOTE — Telephone Encounter (Signed)
 error

## 2023-08-23 ENCOUNTER — Ambulatory Visit

## 2023-08-23 DIAGNOSIS — J4550 Severe persistent asthma, uncomplicated: Secondary | ICD-10-CM | POA: Diagnosis not present

## 2023-08-25 ENCOUNTER — Other Ambulatory Visit: Payer: Self-pay | Admitting: Allergy and Immunology

## 2023-09-14 ENCOUNTER — Other Ambulatory Visit: Payer: Self-pay | Admitting: Allergy and Immunology

## 2023-09-20 ENCOUNTER — Ambulatory Visit

## 2023-09-20 DIAGNOSIS — J4550 Severe persistent asthma, uncomplicated: Secondary | ICD-10-CM | POA: Diagnosis not present

## 2023-10-18 ENCOUNTER — Ambulatory Visit

## 2023-10-18 DIAGNOSIS — J4550 Severe persistent asthma, uncomplicated: Secondary | ICD-10-CM

## 2023-11-05 ENCOUNTER — Other Ambulatory Visit: Payer: Self-pay | Admitting: Allergy and Immunology

## 2023-11-15 ENCOUNTER — Ambulatory Visit

## 2023-11-15 DIAGNOSIS — J4550 Severe persistent asthma, uncomplicated: Secondary | ICD-10-CM

## 2023-12-08 ENCOUNTER — Other Ambulatory Visit: Payer: Self-pay

## 2023-12-08 MED ORDER — MONTELUKAST SODIUM 10 MG PO TABS: ORAL_TABLET | ORAL | 0 refills | Status: AC

## 2023-12-13 ENCOUNTER — Ambulatory Visit (INDEPENDENT_AMBULATORY_CARE_PROVIDER_SITE_OTHER)

## 2023-12-13 DIAGNOSIS — J4550 Severe persistent asthma, uncomplicated: Secondary | ICD-10-CM

## 2023-12-25 ENCOUNTER — Ambulatory Visit: Admitting: Allergy and Immunology

## 2024-01-10 ENCOUNTER — Ambulatory Visit

## 2024-01-10 DIAGNOSIS — J4550 Severe persistent asthma, uncomplicated: Secondary | ICD-10-CM

## 2024-01-25 ENCOUNTER — Ambulatory Visit: Admitting: Allergy and Immunology

## 2024-01-25 VITALS — BP 110/88 | HR 68 | Temp 98.1°F | Resp 20 | Ht 61.4 in | Wt 183.6 lb

## 2024-01-25 DIAGNOSIS — K219 Gastro-esophageal reflux disease without esophagitis: Secondary | ICD-10-CM

## 2024-01-25 DIAGNOSIS — J3089 Other allergic rhinitis: Secondary | ICD-10-CM | POA: Diagnosis not present

## 2024-01-25 DIAGNOSIS — J4550 Severe persistent asthma, uncomplicated: Secondary | ICD-10-CM | POA: Diagnosis not present

## 2024-01-25 MED ORDER — LORATADINE 10 MG PO TABS: ORAL_TABLET | ORAL | 5 refills | Status: AC

## 2024-01-25 MED ORDER — OMEPRAZOLE 40 MG PO CPDR: 40.0000 mg | DELAYED_RELEASE_CAPSULE | Freq: Two times a day (BID) | ORAL | 5 refills | Status: AC

## 2024-01-25 MED ORDER — FLUTICASONE PROPIONATE 50 MCG/ACT NA SUSP: NASAL | 5 refills | Status: AC

## 2024-01-25 NOTE — Progress Notes (Signed)
 "  [place] - [place] - [place] - [place] - [place]   Follow-up Note  Referring Provider: Ina Marcellus RAMAN, MD Primary Provider: Ina Marcellus RAMAN, MD Date of Office Visit: 01/25/2024  Subjective:   Ashley Eaton (DOB: [birth year]) is a 64 y.o. female who [facility] in re-evaluation of the following:  HPI: Ashley Eaton returns to this clinic in evaluation of asthma, allergic rhinitis, LPR.  I last saw her in this clinic 17 April 2023.  She is doing very well regarding her asthma while utilizing anti-TSLP antibody along with a collection of anti-inflammatory agents for both her upper and lower airway.  Rarely does she need to use the short acting bronchodilator.  She did have what appeared to be a viral induced flareup October 2025 in association with bilateral otitis media for which she received an antibiotic and steroid.  Other than that event she has not required any additional systemic steroids or antibiotics and overall is very pleased with the response that she is receiving on her current plan.  And her throat and her reflux is going very well while using a proton pump inhibitor on a consistent basis.  She has obtained the flu vaccine and the pneumonia vaccine this year.  Allergies as of 01/25/2024       Reactions   Amoxicillin-pot Clavulanate Other (See Comments)   Hurts stomach   Other Other (See Comments)   Synthetic pain medication makes her heart race ?name   Prednisone  Other (See Comments)   tablet form, upset stomach, states it makes her crazy        Medication List    albuterol  108 (90 Base) MCG/ACT inhaler Commonly known as: VENTOLIN  HFA 2 puffs every 4-6 hours as needed   Breztri  Aerosphere 160-9-4.8 MCG/ACT Aero inhaler Generic drug: budesonide -glycopyrrolate -formoterol  INHALE 2 PUFFS TWO TIMES DAILIY WITH SPACER   fluticasone  50 MCG/ACT nasal spray Commonly known as: FLONASE  SPRAY 1 SPRAY INTO EACH NOSTRIL  TWICE A DAY AS DIRECTED   loratadine  10 MG tablet Commonly known as: CLARITIN  TAKE 1 TABLET BY MOUTH EVERY DAY   METAMUCIL FIBER PO Take by mouth.   montelukast  10 MG tablet Commonly known as: SINGULAIR  TAKE ONE TABLET ONCE DAILY AS DIRECTED   omeprazole  40 MG capsule Commonly known as: PRILOSEC TAKE 1 CAPSULE BY MOUTH TWICE A DAY   PROBIOTIC PO Take by mouth.   Tezspire  210 MG/1. syringe Generic drug: tezepelumab -ekko INJECT 1 SYRINGE UNDER THE SKIN EVERY 28 DAYS    Past Medical History:  Diagnosis Date   Asthma    Asthma    Cancer (HCC) 06?   vulva   Complication of anesthesia    LPRD (laryngopharyngeal reflux disease)    Neuromuscular disorder (HCC)    Morton's neuroma, plantar fasciitis s/p 10   PONV (postoperative nausea and vomiting)     Past Surgical History:  Procedure Laterality Date   ABLATION SAPHENOUS VEIN W/ RFA Left 2021   CERVICAL CONIZATION W/BX  06   CHOLECYSTECTOMY N/A 10/15/2013   Procedure: LAPAROSCOPIC CHOLECYSTECTOMY WITH INTRAOPERATIVE CHOLANGIOGRAM;  Surgeon: Donnice Lima, MD;  Location: [facility];  Service: General;  Laterality: N/A;   COLONOSCOPY  12/2014   with Dr. Kristie next one in 5 yrs   DIAGNOSTIC LAPAROSCOPY     endometriosis- 64 yrs old   FOOT SURGERY Bilateral 2010,2011   For Plantar Faciitis   FOOT SURGERY Bilateral 1992,95   For Morton's Neuroma    Review of  systems negative except as noted in HPI / PMHx or noted below:  Review of Systems  Constitutional: Negative.   HENT: Negative.    Eyes: Negative.   Respiratory: Negative.    Cardiovascular: Negative.   Gastrointestinal: Negative.   Genitourinary: Negative.   Musculoskeletal: Negative.   Skin: Negative.   Neurological: Negative.   Endo/Heme/Allergies: Negative.   Psychiatric/Behavioral: Negative.       Objective:   Vitals:   01/25/24 1610  BP: 110/88  Pulse: 68  Resp: 20  Temp: 98.1 F (36.7 C)  SpO2: 95%   Height: 5' 1.4 (156 cm)  Weight: 183  lb 9.6 oz (83.3 kg)   Physical Exam Constitutional:      Appearance: She is not diaphoretic.  HENT:     Head: Normocephalic.     Right Ear: Tympanic membrane, ear canal and external ear normal.     Left Ear: Tympanic membrane, ear canal and external ear normal.     Nose: Nose normal. No mucosal edema or rhinorrhea.     Mouth/Throat:     Pharynx: Uvula midline. No oropharyngeal exudate.  Eyes:     Conjunctiva/sclera: Conjunctivae normal.  Neck:     Thyroid: No thyromegaly.     Trachea: Trachea normal. No tracheal tenderness or tracheal deviation.  Cardiovascular:     Rate and Rhythm: Normal rate and regular rhythm.     Heart sounds: Normal heart sounds, S1 normal and S2 normal. No murmur heard. Pulmonary:     Effort: No respiratory distress.     Breath sounds: Normal breath sounds. No stridor. No wheezing or rales.  Lymphadenopathy:     Head:     Right side of head: No tonsillar adenopathy.     Left side of head: No tonsillar adenopathy.     Cervical: No cervical adenopathy.  Skin:    Findings: No erythema or rash.     Nails: There is no clubbing.  Neurological:     Mental Status: She is alert.     Diagnostics: Spirometry was performed and demonstrated an FEV1 of 1.99 at 88 % of predicted.  Assessment and Plan:   1. Asthma, severe persistent, well-controlled (HCC)   2. Other allergic rhinitis   3. LPRD (laryngopharyngeal reflux disease)    1.  Continue to treat inflammatory:   A. Flonase  1 spray each nostril 1-2 times per day  B. montelukast  10 mg tablet 1 time per day  C. Breztri  - 2 inhalations 1-2 times per day with spacer   D. Tezepelumab  injection every 4 weeks  2.  Continue to treat and prevent reflux/LPR:   A.  Omeprazole  40 mg - 2 times a day  B.  Replace throat clearing with swallowing/drinking maneuver  3.  If needed:      A. OTC antihistamine  B. OTC nasal saline   C. Proair  HFA 2 inhalations every 4-6 hours  D. Mucinex DM - 2 tablets 2 times per  day  E. Gentle equalization of ear pressure with nasal pinching inflation   4. Return to clinic in 6 months or earlier if problem  5. Influenza = Tamiflu. Covid = Paxlovid  Ashley Eaton is really doing very well on her current plan of using anti-TSLP antibody along with a collection of anti-inflammatory agents for her airway and also addressing the issue of her reflux with omeprazole  twice a day.  She is going to continue on this plan and she has a selection of other agents that can be utilized  should they be required as noted above.  Assuming she does well I will see her back in this clinic in 6 months or earlier if there is a problem.  Ashley Denis, MD Allergy / Immunology [facility] "

## 2024-01-25 NOTE — Patient Instructions (Addendum)
" °  1.  Continue to treat inflammatory:   A. Flonase  1 spray each nostril 1-2 times per day  B. montelukast  10 mg tablet 1 time per day  C. Breztri  - 2 inhalations 1-2 times per day with spacer   D. Tezepelumab  injection every 4 weeks  2.  Continue to treat and prevent reflux/LPR:   A.  Omeprazole  40 mg - 2 times a day  B.  Replace throat clearing with swallowing/drinking maneuver  3.  If needed:      A. OTC antihistamine  B. OTC nasal saline   C. Proair  HFA 2 inhalations every 4-6 hours  D. Mucinex DM - 2 tablets 2 times per day  E. Gentle equalization of ear pressure with nasal pinching inflation   4. Return to clinic in 6 months or earlier if problem  5. Influenza = Tamiflu. Covid = Paxlovid "

## 2024-01-30 ENCOUNTER — Encounter: Payer: Self-pay | Admitting: Allergy and Immunology

## 2024-02-07 ENCOUNTER — Ambulatory Visit

## 2024-02-07 DIAGNOSIS — J4550 Severe persistent asthma, uncomplicated: Secondary | ICD-10-CM

## 2024-03-06 ENCOUNTER — Ambulatory Visit
